# Patient Record
Sex: Male | Born: 1975 | Race: Black or African American | Hispanic: No | Marital: Single | Smoking: Current every day smoker
Health system: Southern US, Community
[De-identification: ages and names within clinical notes are randomized; demographics above are authoritative.]

## PROBLEM LIST (undated history)

## (undated) DIAGNOSIS — J45909 Unspecified asthma, uncomplicated: Secondary | ICD-10-CM

---

## 2020-11-06 ENCOUNTER — Encounter (HOSPITAL_COMMUNITY): Payer: Self-pay | Admitting: Emergency Medicine

## 2020-11-06 ENCOUNTER — Other Ambulatory Visit: Payer: Self-pay

## 2020-11-06 ENCOUNTER — Ambulatory Visit (HOSPITAL_COMMUNITY)
Admission: EM | Admit: 2020-11-06 | Discharge: 2020-11-06 | Disposition: A | Discharge status: Home / Self Care | Payer: Self-pay | Attending: Internal Medicine | Admitting: Internal Medicine

## 2020-11-06 DIAGNOSIS — J4531 Mild persistent asthma with (acute) exacerbation: Secondary | ICD-10-CM

## 2020-11-06 HISTORY — DX: Unspecified asthma, uncomplicated: J45.909

## 2020-11-06 MED ORDER — ALBUTEROL SULFATE HFA 108 (90 BASE) MCG/ACT IN AERS
2.0000 | INHALATION_SPRAY | Freq: Once | RESPIRATORY_TRACT | Status: AC
  Administered 2020-11-06: 15:00:00 2 via RESPIRATORY_TRACT

## 2020-11-06 MED ORDER — BUDESONIDE-FORMOTEROL FUMARATE 160-4.5 MCG/ACT IN AERO
2.0000 | INHALATION_SPRAY | Freq: Two times a day (BID) | RESPIRATORY_TRACT | 2 refills | Status: DC
Start: 2020-11-06 — End: 2020-11-26

## 2020-11-06 MED ORDER — PREDNISONE 10 MG PO TABS: 20.0000 mg | ORAL_TABLET | Freq: Every day | ORAL | 0 refills | Status: DC

## 2020-11-06 MED ORDER — ALBUTEROL SULFATE HFA 108 (90 BASE) MCG/ACT IN AERS
INHALATION_SPRAY | RESPIRATORY_TRACT | Status: AC
  Filled 2020-11-06: qty 6.7

## 2020-11-06 NOTE — ED Provider Notes (Signed)
[facility]    CSN: [id number] Arrival date & time: 11/06/20  1222      History   Chief Complaint Shortness of breath, cough and wheezing of 2 days duration.  HPI Jimmy Herring is a 44 y.o. male with a history of mild persistent asthma on albuterol and Symbicort comes to urgent care complaining of shortness of breath, chest tightness, wheezing and coughing of 2 days duration.  Symptoms started insidiously and has been persistent.  He does not have any inhalers at this time.  He denies any runny nose, sore throat or generalized body aches.  No fever or chills.  No sick contacts.  Patient has run out of his inhalers.  He recently moved here from [facility].   HPI  Past Medical History:  Diagnosis Date  . Asthma     There are no problems to display for this patient.   History reviewed. No pertinent surgical history.     Home Medications    Prior to Admission medications   Medication Sig Start Date End Date Taking? Authorizing Provider  budesonide-formoterol (SYMBICORT) 160-4.5 MCG/ACT inhaler Inhale 2 puffs into the lungs in the morning and at bedtime. 11/06/20  Yes Hiram Mciver, Britta Mccreedy, MD  predniSONE (DELTASONE) 10 MG tablet Take 2 tablets (20 mg total) by mouth daily. 11/06/20  Yes Alizea Pell, Britta Mccreedy, MD    Family History No family history on file.  Social History Social History   Tobacco Use  . Smoking status: Never Smoker  Substance Use Topics  . Alcohol use: Yes  . Drug use: Never     Allergies   Patient has no known allergies.   Review of Systems Review of Systems  Constitutional: Negative.   Respiratory: Negative for cough, chest tightness, shortness of breath and wheezing.   Cardiovascular: Negative for chest pain.  Gastrointestinal: Negative.      Physical Exam Triage Vital Signs ED Triage Vitals  Enc Vitals Group     BP      Pulse      Resp      Temp      Temp src      SpO2      Weight      Height      Head  Circumference      Peak Flow      Pain Score      Pain Loc      Pain Edu?      Excl. in GC?    No data found.  Updated Vital Signs BP 133/80 (BP Location: Right Arm)   Pulse 73   Temp 98 F (36.7 C) (Oral)   Resp 20   SpO2 98%   Visual Acuity Right Eye Distance:   Left Eye Distance:   Bilateral Distance:    Right Eye Near:   Left Eye Near:    Bilateral Near:     Physical Exam Vitals and nursing note reviewed.  Constitutional:      General: He is not in acute distress.    Appearance: He is not ill-appearing.  Cardiovascular:     Rate and Rhythm: Normal rate and regular rhythm.     Pulses: Normal pulses.     Heart sounds: Normal heart sounds.  Pulmonary:     Effort: Pulmonary effort is normal.     Breath sounds: No stridor. Wheezing present. No rhonchi.  Abdominal:     General: Abdomen is flat. Bowel sounds are normal.  Musculoskeletal:  General: Normal range of motion.  Skin:    Capillary Refill: Capillary refill takes less than 2 seconds.  Neurological:     Mental Status: He is alert.      UC Treatments / Results  Labs (all labs ordered are listed, but only abnormal results are displayed) Labs Reviewed - No data to display  EKG   Radiology No results found.  Procedures Procedures (including critical care time)  Medications Ordered in UC Medications  albuterol (VENTOLIN HFA) 108 (90 Base) MCG/ACT inhaler 2 puff (2 puffs Inhalation Given 11/06/20 1509)    Initial Impression / Assessment and Plan / UC Course  I have reviewed the triage vital signs and the nursing notes.  Pertinent labs & imaging results that were available during my care of the patient were reviewed by me and considered in my medical decision making (see chart for details).     1.  Mild persistent asthma with acute exacerbation: Albuterol inhaler Symbicort inhaler Prednisone 20 mg orally daily for 5 days If symptoms worsen please return to the urgent care to be  reevaluated. Final Clinical Impressions(s) / UC Diagnoses   Final diagnoses:  Mild persistent asthma with (acute) exacerbation     Discharge Instructions     Take your medications as directed If symptoms worsen please go to the emergency room for nebulization treatments.    ED Prescriptions    Medication Sig Dispense Auth. Provider   budesonide-formoterol (SYMBICORT) 160-4.5 MCG/ACT inhaler Inhale 2 puffs into the lungs in the morning and at bedtime. 1 each Kameshia Madruga, Britta Mccreedy, MD   predniSONE (DELTASONE) 10 MG tablet Take 2 tablets (20 mg total) by mouth daily. 15 tablet Blakeley Margraf, Britta Mccreedy, MD     PDMP not reviewed this encounter.   Merrilee Jansky, MD 11/06/20 [phone]

## 2020-11-06 NOTE — Discharge Instructions (Signed)
Take your medications as directed If symptoms worsen please go to the emergency room for nebulization treatments.

## 2020-11-06 NOTE — ED Triage Notes (Signed)
Patient has a history of asthma. Patient thinks his asthma is acting up.Jimmy Herring he needs albuterol, Symbicort and prednisone Reports he is not from this area, but will be staying indefinitely.  Patient has tightness, cough, congestion.  Patient does not have inhaler

## 2020-11-26 ENCOUNTER — Ambulatory Visit (HOSPITAL_COMMUNITY)
Admission: EM | Admit: 2020-11-26 | Discharge: 2020-11-26 | Disposition: A | Discharge status: Home / Self Care | Payer: Self-pay | Attending: Student | Admitting: Student

## 2020-11-26 ENCOUNTER — Encounter (HOSPITAL_COMMUNITY): Payer: Self-pay | Admitting: Emergency Medicine

## 2020-11-26 ENCOUNTER — Other Ambulatory Visit: Payer: Self-pay

## 2020-11-26 DIAGNOSIS — J4521 Mild intermittent asthma with (acute) exacerbation: Secondary | ICD-10-CM

## 2020-11-26 DIAGNOSIS — R062 Wheezing: Secondary | ICD-10-CM

## 2020-11-26 MED ORDER — ALBUTEROL SULFATE HFA 108 (90 BASE) MCG/ACT IN AERS
INHALATION_SPRAY | RESPIRATORY_TRACT | Status: AC
  Filled 2020-11-26: qty 6.7

## 2020-11-26 MED ORDER — ALBUTEROL SULFATE HFA 108 (90 BASE) MCG/ACT IN AERS
2.0000 | INHALATION_SPRAY | Freq: Once | RESPIRATORY_TRACT | Status: AC
Start: 2020-11-26 — End: 2020-11-26
  Administered 2020-11-26: 2 via RESPIRATORY_TRACT

## 2020-11-26 MED ORDER — ALBUTEROL SULFATE HFA 108 (90 BASE) MCG/ACT IN AERS: 1.0000 | INHALATION_SPRAY | Freq: Four times a day (QID) | RESPIRATORY_TRACT | 2 refills | Status: DC | PRN

## 2020-11-26 MED ORDER — METHYLPREDNISOLONE SODIUM SUCC 125 MG IJ SOLR
80.0000 mg | Freq: Once | INTRAMUSCULAR | Status: AC
  Administered 2020-11-26: 80 mg via INTRAMUSCULAR

## 2020-11-26 MED ORDER — METHYLPREDNISOLONE SODIUM SUCC 125 MG IJ SOLR
INTRAMUSCULAR | Status: AC
  Filled 2020-11-26: qty 2

## 2020-11-26 MED ORDER — BUDESONIDE-FORMOTEROL FUMARATE 160-4.5 MCG/ACT IN AERO: 2.0000 | INHALATION_SPRAY | Freq: Two times a day (BID) | RESPIRATORY_TRACT | 2 refills | Status: DC

## 2020-11-26 NOTE — ED Triage Notes (Signed)
Pt here for asthma flare up onset this am... Sx include cough and SOB  Needing refill on his rescue inhaler  A&O x4... NAD.Marland Kitchen. ambulatory

## 2020-11-26 NOTE — ED Provider Notes (Signed)
[facility]    CSN: [id number] Arrival date & time: 11/26/20  1259      History   Chief Complaint Chief Complaint  Patient presents with  . Asthma    HPI Jimmy Herring is a 45 y.o. male presenting for asthma exacerbation. He states he's out of inhalers. He was last here 11/06/2020 for asthma; he was given albuterol inhaler, which he has been using since then, though he ran out 3 days ago. Scripts for albuterol, symbicort, and prednisone were sent, but he was unable to fill these due to cost. He endorses 12 hours of shortness of breath and cough. Feeling well otherwise.  Denies fevers/chills, n/v/d,chest pain, congestion, facial pain, teeth pain, headaches, sore throat, loss of taste/smell, swollen lymph nodes, ear pain.  Denies chest pain, fevers.    HPI  Past Medical History:  Diagnosis Date  . Asthma     There are no problems to display for this patient.   History reviewed. No pertinent surgical history.     Home Medications    Prior to Admission medications   Medication Sig Start Date End Date Taking? Authorizing Provider  albuterol (VENTOLIN HFA) 108 (90 Base) MCG/ACT inhaler Inhale 1-2 puffs into the lungs every 6 (six) hours as needed for wheezing or shortness of breath. 11/26/20  Yes Rhys Martini, PA-C  budesonide-formoterol [facility]) 160-4.5 MCG/ACT inhaler Inhale 2 puffs into the lungs in the morning and at bedtime. 11/26/20  Yes Rhys Martini, PA-C    Family History History reviewed. No pertinent family history.  Social History Social History   Tobacco Use  . Smoking status: Never Smoker  . Smokeless tobacco: Never Used  Substance Use Topics  . Alcohol use: Yes  . Drug use: Never     Allergies   Patient has no known allergies.   Review of Systems Review of Systems  Constitutional: Negative for appetite change, chills and fever.  HENT: Negative for congestion, ear pain, rhinorrhea, sinus pressure, sinus pain and sore  throat.   Eyes: Negative for redness and visual disturbance.  Respiratory: Positive for cough and shortness of breath. Negative for chest tightness and wheezing.   Cardiovascular: Negative for chest pain and palpitations.  Gastrointestinal: Negative for abdominal pain, constipation, diarrhea, nausea and vomiting.  Genitourinary: Negative for dysuria, frequency and urgency.  Musculoskeletal: Negative for myalgias.  Neurological: Negative for dizziness, weakness and headaches.  Psychiatric/Behavioral: Negative for confusion.  All other systems reviewed and are negative.    Physical Exam Triage Vital Signs ED Triage Vitals [11/26/20 1346]  Enc Vitals Group     BP 123/82     Pulse Rate 72     Resp 16     Temp 98.2 F (36.8 C)     Temp Source Oral     SpO2 97 %     Weight      Height      Head Circumference      Peak Flow      Pain Score      Pain Loc      Pain Edu?      Excl. in GC?    No data found.  Updated Vital Signs BP 123/82 (BP Location: Left Arm)   Pulse 72   Temp 98.2 F (36.8 C) (Oral)   Resp 16   SpO2 97%   Visual Acuity Right Eye Distance:   Left Eye Distance:   Bilateral Distance:    Right Eye Near:   Left  Eye Near:    Bilateral Near:     Physical Exam Vitals reviewed.  Constitutional:      General: He is not in acute distress.    Appearance: Normal appearance. He is not ill-appearing.  HENT:     Head: Normocephalic and atraumatic.     Right Ear: Hearing, tympanic membrane, ear canal and external ear normal. No swelling or tenderness. There is no impacted cerumen. No mastoid tenderness. Tympanic membrane is not perforated, erythematous, retracted or bulging.     Left Ear: Hearing, tympanic membrane, ear canal and external ear normal. No swelling or tenderness. There is no impacted cerumen. No mastoid tenderness. Tympanic membrane is not perforated, erythematous, retracted or bulging.     Nose:     Right Sinus: No maxillary sinus tenderness or  frontal sinus tenderness.     Left Sinus: No maxillary sinus tenderness or frontal sinus tenderness.     Mouth/Throat:     Mouth: Mucous membranes are moist.     Pharynx: Uvula midline. No oropharyngeal exudate or posterior oropharyngeal erythema.     Tonsils: No tonsillar exudate.  Cardiovascular:     Rate and Rhythm: Normal rate and regular rhythm.     Heart sounds: Normal heart sounds.  Pulmonary:     Breath sounds: Normal air entry. Decreased breath sounds and wheezing present. No rhonchi or rales.     Comments: Scattered wheezes througout Chest:     Chest wall: No tenderness.  Abdominal:     General: Abdomen is flat. Bowel sounds are normal.     Tenderness: There is no abdominal tenderness. There is no guarding or rebound.  Lymphadenopathy:     Cervical: No cervical adenopathy.  Neurological:     General: No focal deficit present.     Mental Status: He is alert and oriented to person, place, and time.  Psychiatric:        Attention and Perception: Attention and perception normal.        Mood and Affect: Mood and affect normal.        Behavior: Behavior normal. Behavior is cooperative.        Thought Content: Thought content normal.        Judgment: Judgment normal.      UC Treatments / Results  Labs (all labs ordered are listed, but only abnormal results are displayed) Labs Reviewed - No data to display  EKG   Radiology No results found.  Procedures Procedures (including critical care time)  Medications Ordered in UC Medications  methylPREDNISolone sodium succinate (SOLU-MEDROL) 125 mg/2 mL injection 80 mg (has no administration in time range)  albuterol (VENTOLIN HFA) 108 (90 Base) MCG/ACT inhaler 2 puff (has no administration in time range)    Initial Impression / Assessment and Plan / UC Course  I have reviewed the triage vital signs and the nursing notes.  Pertinent labs & imaging results that were available during my care of the patient were reviewed  by me and considered in my medical decision making (see chart for details).     Scripts for albuterol and Symbicort sent.  Albuterol inhaler provided today. We discussed the option of prednisone taper vs prednisone shot; pt requests injection, as cost is prohibitive to him picking up his prescriptions. Provided this today.   Seek additional medical attention if chest pain, shortness of breath, persistance/worsening of symptoms despite treatment, etc.   Final Clinical Impressions(s) / UC Diagnoses   Final diagnoses:  Mild intermittent asthma with exacerbation  Wheezing     Discharge Instructions     -We gave you a shot of prednisone (steroid) today, as well as an albuterol inhaler. Use inhaler as needed.  -I also sent a prescription for Symbicort and Albuterol inhalers. I sent two refills of each. Use the albuterol inhaler as needed, and Symbicort two puffs twice daily.      ED Prescriptions    Medication Sig Dispense Auth. Provider   budesonide-formoterol (SYMBICORT) 160-4.5 MCG/ACT inhaler Inhale 2 puffs into the lungs in the morning and at bedtime. 1 each Rhys Martini, PA-C   albuterol (VENTOLIN HFA) 108 (90 Base) MCG/ACT inhaler Inhale 1-2 puffs into the lungs every 6 (six) hours as needed for wheezing or shortness of breath. 1 each Rhys Martini, PA-C     PDMP not reviewed this encounter.   Rhys Martini, PA-C 11/26/20 1449

## 2020-11-26 NOTE — Discharge Instructions (Addendum)
-  We gave you a shot of prednisone (steroid) today, as well as an albuterol inhaler. Use inhaler as needed.  -I also sent a prescription for Symbicort and Albuterol inhalers. I sent two refills of each. Use the albuterol inhaler as needed, and Symbicort two puffs twice daily.

## 2021-08-11 ENCOUNTER — Encounter (HOSPITAL_COMMUNITY): Payer: Self-pay | Admitting: Emergency Medicine

## 2021-08-11 ENCOUNTER — Emergency Department (HOSPITAL_COMMUNITY): Payer: Self-pay

## 2021-08-11 ENCOUNTER — Inpatient Hospital Stay (HOSPITAL_COMMUNITY)
Admission: EM | Admit: 2021-08-11 | Discharge: 2021-08-13 | DRG: 203 | Disposition: A | Discharge status: Home / Self Care | Payer: Self-pay | Attending: Internal Medicine | Admitting: Internal Medicine

## 2021-08-11 ENCOUNTER — Other Ambulatory Visit: Payer: Self-pay

## 2021-08-11 DIAGNOSIS — Z825 Family history of asthma and other chronic lower respiratory diseases: Secondary | ICD-10-CM

## 2021-08-11 DIAGNOSIS — J45909 Unspecified asthma, uncomplicated: Secondary | ICD-10-CM | POA: Diagnosis present

## 2021-08-11 DIAGNOSIS — Z7951 Long term (current) use of inhaled steroids: Secondary | ICD-10-CM

## 2021-08-11 DIAGNOSIS — Z789 Other specified health status: Secondary | ICD-10-CM

## 2021-08-11 DIAGNOSIS — Z20822 Contact with and (suspected) exposure to covid-19: Secondary | ICD-10-CM | POA: Diagnosis present

## 2021-08-11 DIAGNOSIS — Z87891 Personal history of nicotine dependence: Secondary | ICD-10-CM

## 2021-08-11 DIAGNOSIS — F1020 Alcohol dependence, uncomplicated: Secondary | ICD-10-CM | POA: Diagnosis present

## 2021-08-11 DIAGNOSIS — F1410 Cocaine abuse, uncomplicated: Secondary | ICD-10-CM | POA: Diagnosis present

## 2021-08-11 DIAGNOSIS — J45901 Unspecified asthma with (acute) exacerbation: Secondary | ICD-10-CM

## 2021-08-11 DIAGNOSIS — J4551 Severe persistent asthma with (acute) exacerbation: Principal | ICD-10-CM | POA: Diagnosis present

## 2021-08-11 DIAGNOSIS — F1490 Cocaine use, unspecified, uncomplicated: Secondary | ICD-10-CM

## 2021-08-11 LAB — CBC WITH DIFFERENTIAL/PLATELET
Abs Immature Granulocytes: 0.04 10*3/uL (ref 0.00–0.07)
Basophils Absolute: 0.1 10*3/uL (ref 0.0–0.1)
Basophils Relative: 1 %
Eosinophils Absolute: 0.8 10*3/uL — ABNORMAL HIGH (ref 0.0–0.5)
Eosinophils Relative: 7 %
HCT: 48.2 % (ref 39.0–52.0)
Hemoglobin: 15.8 g/dL (ref 13.0–17.0)
Immature Granulocytes: 0 %
Lymphocytes Relative: 34 %
Lymphs Abs: 4.1 10*3/uL — ABNORMAL HIGH (ref 0.7–4.0)
MCH: 33.4 pg (ref 26.0–34.0)
MCHC: 32.8 g/dL (ref 30.0–36.0)
MCV: 101.9 fL — ABNORMAL HIGH (ref 80.0–100.0)
Monocytes Absolute: 1.3 10*3/uL — ABNORMAL HIGH (ref 0.1–1.0)
Monocytes Relative: 11 %
Neutro Abs: 5.7 10*3/uL (ref 1.7–7.7)
Neutrophils Relative %: 47 %
Platelets: 285 10*3/uL (ref 150–400)
RBC: 4.73 MIL/uL (ref 4.22–5.81)
RDW: 12.3 % (ref 11.5–15.5)
WBC: 12.1 10*3/uL — ABNORMAL HIGH (ref 4.0–10.5)
nRBC: 0 % (ref 0.0–0.2)

## 2021-08-11 LAB — CBC
HCT: 44.5 % (ref 39.0–52.0)
Hemoglobin: 14.9 g/dL (ref 13.0–17.0)
MCH: 33.6 pg (ref 26.0–34.0)
MCHC: 33.5 g/dL (ref 30.0–36.0)
MCV: 100.2 fL — ABNORMAL HIGH (ref 80.0–100.0)
Platelets: 264 10*3/uL (ref 150–400)
RBC: 4.44 MIL/uL (ref 4.22–5.81)
RDW: 12.5 % (ref 11.5–15.5)
WBC: 10.6 10*3/uL — ABNORMAL HIGH (ref 4.0–10.5)
nRBC: 0 % (ref 0.0–0.2)

## 2021-08-11 LAB — BASIC METABOLIC PANEL
Anion gap: 12 (ref 5–15)
BUN: 9 mg/dL (ref 6–20)
CO2: 21 mmol/L — ABNORMAL LOW (ref 22–32)
Calcium: 8.8 mg/dL — ABNORMAL LOW (ref 8.9–10.3)
Chloride: 105 mmol/L (ref 98–111)
Creatinine, Ser: 1.21 mg/dL (ref 0.61–1.24)
GFR, Estimated: >60 mL/min (ref >60)
Glucose, Bld: 95 mg/dL (ref 70–99)
Potassium: 3.7 mmol/L (ref 3.5–5.1)
Sodium: 138 mmol/L (ref 135–145)

## 2021-08-11 LAB — RESP PANEL BY RT-PCR (FLU A&B, COVID) ARPGX2
Influenza A by PCR: NEGATIVE
Influenza B by PCR: NEGATIVE
SARS Coronavirus 2 by RT PCR: NEGATIVE

## 2021-08-11 LAB — HIV ANTIBODY (ROUTINE TESTING W REFLEX): HIV Screen 4th Generation wRfx: NONREACTIVE

## 2021-08-11 MED ORDER — IPRATROPIUM-ALBUTEROL 0.5-2.5 (3) MG/3ML IN SOLN
3.0000 mL | Freq: Four times a day (QID) | RESPIRATORY_TRACT | Status: DC
  Administered 2021-08-11 – 2021-08-13 (×10): 3 mL via RESPIRATORY_TRACT
  Filled 2021-08-11 (×10): qty 3

## 2021-08-11 MED ORDER — BUDESONIDE 0.25 MG/2ML IN SUSP
0.2500 mg | Freq: Two times a day (BID) | RESPIRATORY_TRACT | Status: DC
  Administered 2021-08-11 – 2021-08-13 (×5): 0.25 mg via RESPIRATORY_TRACT
  Filled 2021-08-11 (×5): qty 2

## 2021-08-11 MED ORDER — DEXAMETHASONE SODIUM PHOSPHATE 10 MG/ML IJ SOLN: 10.0000 mg | Freq: Once | INTRAMUSCULAR | Status: DC

## 2021-08-11 MED ORDER — IPRATROPIUM BROMIDE 0.02 % IN SOLN
0.5000 mg | Freq: Once | RESPIRATORY_TRACT | Status: AC
  Administered 2021-08-11: 0.5 mg via RESPIRATORY_TRACT
  Filled 2021-08-11: qty 2.5

## 2021-08-11 MED ORDER — ALBUTEROL SULFATE (2.5 MG/3ML) 0.083% IN NEBU
2.5000 mg | INHALATION_SOLUTION | RESPIRATORY_TRACT | Status: DC | PRN
  Administered 2021-08-11: 2.5 mg via RESPIRATORY_TRACT
  Filled 2021-08-11: qty 3

## 2021-08-11 MED ORDER — LORAZEPAM 1 MG PO TABS: 1.0000 mg | ORAL_TABLET | ORAL | Status: DC | PRN

## 2021-08-11 MED ORDER — ALBUTEROL SULFATE (2.5 MG/3ML) 0.083% IN NEBU
10.0000 mg/h | INHALATION_SOLUTION | Freq: Once | RESPIRATORY_TRACT | Status: AC
  Administered 2021-08-11: 10 mg/h via RESPIRATORY_TRACT
  Filled 2021-08-11: qty 3

## 2021-08-11 MED ORDER — MAGNESIUM SULFATE 2 GM/50ML IV SOLN: 2.0000 g | Freq: Once | INTRAVENOUS | Status: DC

## 2021-08-11 MED ORDER — METHYLPREDNISOLONE SODIUM SUCC 125 MG IJ SOLR
125.0000 mg | Freq: Once | INTRAMUSCULAR | Status: AC
  Administered 2021-08-11: 125 mg via INTRAVENOUS
  Filled 2021-08-11: qty 2

## 2021-08-11 MED ORDER — ENOXAPARIN SODIUM 40 MG/0.4ML IJ SOSY
40.0000 mg | PREFILLED_SYRINGE | INTRAMUSCULAR | Status: DC
  Administered 2021-08-11 – 2021-08-12 (×2): 40 mg via SUBCUTANEOUS
  Filled 2021-08-11 (×2): qty 0.4

## 2021-08-11 MED ORDER — ACETAMINOPHEN 500 MG PO TABS
1000.0000 mg | ORAL_TABLET | Freq: Once | ORAL | Status: AC
  Administered 2021-08-11: 1000 mg via ORAL
  Filled 2021-08-11: qty 2

## 2021-08-11 MED ORDER — IPRATROPIUM BROMIDE 0.02 % IN SOLN
0.5000 mg | Freq: Once | RESPIRATORY_TRACT | Status: AC
  Filled 2021-08-11: qty 2.5

## 2021-08-11 MED ORDER — ALBUTEROL (5 MG/ML) CONTINUOUS INHALATION SOLN: 10.0000 mg/h | INHALATION_SOLUTION | RESPIRATORY_TRACT | Status: DC

## 2021-08-11 MED ORDER — LORATADINE 10 MG PO TABS
10.0000 mg | ORAL_TABLET | Freq: Once | ORAL | Status: AC
  Administered 2021-08-11: 10 mg via ORAL
  Filled 2021-08-11: qty 1

## 2021-08-11 MED ORDER — ALBUTEROL SULFATE (2.5 MG/3ML) 0.083% IN NEBU
5.0000 mg | INHALATION_SOLUTION | Freq: Once | RESPIRATORY_TRACT | Status: AC
  Administered 2021-08-11: 5 mg via RESPIRATORY_TRACT
  Filled 2021-08-11: qty 6

## 2021-08-11 MED ORDER — ALBUTEROL SULFATE (2.5 MG/3ML) 0.083% IN NEBU
5.0000 mg | INHALATION_SOLUTION | Freq: Once | RESPIRATORY_TRACT | Status: AC
  Administered 2021-08-11: 5 mg via RESPIRATORY_TRACT

## 2021-08-11 MED ORDER — METHYLPREDNISOLONE SODIUM SUCC 125 MG IJ SOLR
120.0000 mg | Freq: Two times a day (BID) | INTRAMUSCULAR | Status: DC
  Administered 2021-08-11 – 2021-08-13 (×5): 120 mg via INTRAVENOUS
  Filled 2021-08-11 (×5): qty 2

## 2021-08-11 MED ORDER — FAMOTIDINE IN NACL 20-0.9 MG/50ML-% IV SOLN
20.0000 mg | Freq: Once | INTRAVENOUS | Status: AC
  Administered 2021-08-11: 20 mg via INTRAVENOUS
  Filled 2021-08-11: qty 50

## 2021-08-11 MED ORDER — THIAMINE HCL 100 MG PO TABS
100.0000 mg | ORAL_TABLET | Freq: Every day | ORAL | Status: DC
  Administered 2021-08-11 – 2021-08-13 (×3): 100 mg via ORAL
  Filled 2021-08-11 (×3): qty 1

## 2021-08-11 MED ORDER — IPRATROPIUM-ALBUTEROL 0.5-2.5 (3) MG/3ML IN SOLN: 3.0000 mL | Freq: Once | RESPIRATORY_TRACT | Status: DC

## 2021-08-11 MED ORDER — ADULT MULTIVITAMIN W/MINERALS CH
1.0000 | ORAL_TABLET | Freq: Every day | ORAL | Status: DC
  Administered 2021-08-11 – 2021-08-13 (×3): 1 via ORAL
  Filled 2021-08-11 (×3): qty 1

## 2021-08-11 MED ORDER — THIAMINE HCL 100 MG/ML IJ SOLN: 100.0000 mg | Freq: Every day | INTRAMUSCULAR | Status: DC

## 2021-08-11 MED ORDER — FOLIC ACID 1 MG PO TABS
1.0000 mg | ORAL_TABLET | Freq: Every day | ORAL | Status: DC
  Administered 2021-08-11 – 2021-08-13 (×3): 1 mg via ORAL
  Filled 2021-08-11 (×3): qty 1

## 2021-08-11 MED ORDER — MAGNESIUM SULFATE 2 GM/50ML IV SOLN
2.0000 g | INTRAVENOUS | Status: AC
  Administered 2021-08-11: 2 g via INTRAVENOUS
  Filled 2021-08-11: qty 50

## 2021-08-11 MED ORDER — IPRATROPIUM BROMIDE 0.02 % IN SOLN
RESPIRATORY_TRACT | Status: AC
  Administered 2021-08-11: 0.5 mg via RESPIRATORY_TRACT
  Filled 2021-08-11: qty 2.5

## 2021-08-11 MED ORDER — ALBUTEROL SULFATE (2.5 MG/3ML) 0.083% IN NEBU: 10.0000 mg/h | INHALATION_SOLUTION | Freq: Once | RESPIRATORY_TRACT | Status: DC

## 2021-08-11 MED ORDER — LORAZEPAM 2 MG/ML IJ SOLN: 1.0000 mg | INTRAMUSCULAR | Status: DC | PRN

## 2021-08-11 NOTE — ED Notes (Signed)
Pt is very unpleasant and unwilling to assist in care. Pt insists that he cannot place the mask on his face for breathing treatments. Pt continues to take off BP cuff despite being asked to leave it on. Pt also continues to complain about breakfast tray.

## 2021-08-11 NOTE — ED Provider Notes (Addendum)
Emergency Medicine Provider Triage Evaluation Note  Jimmy Herring , a 45 y.o. male  was evaluated in triage.  Pt complains of SOB. Level 5 caveat secondary to acuity of condition, moderate distress. Reports being out of his prescribed medications. Has been using albuterol from friends w/o relief. Symptoms worsened while at work tonight. C/o chest discomfort from persistent coughing.  Review of Systems  Positive: SOB, wheezing, chest pain. Negative: As above  Physical Exam  There were no vitals taken for this visit. Gen:   Awake, moderate distress Resp:  Dyspneic, tachypneic. Diffuse expiratory wheezing. Spastic, dry, nonproductive cough. MSK:   Moves extremities without difficulty Other:  Speech truncated  Medical Decision Making  Medically screening exam initiated at 1:04 AM.  Appropriate orders placed.  [name] was informed that the remainder of the evaluation will be completed by another provider, this initial triage assessment does not replace that evaluation, and the importance of remaining in the ED until their evaluation is complete.  Asthma exacerbation   Antony Madura, PA-C 08/11/21 0110    Antony Madura, PA-C 08/11/21 0119    Tilden Fossa, MD 08/11/21 0242    Antony Madura, PA-C 08/11/21 0250    Tilden Fossa, MD 08/11/21 [phone]

## 2021-08-11 NOTE — ED Notes (Signed)
Pt took off hospital gown and put on his T-shirt. Pt does not want to be in a hospital gown

## 2021-08-11 NOTE — ED Notes (Signed)
Pt requested a warm blanket and pillow. Pt provided with a warm blanket and told that if a pillow is available he will be given one, however sometimes there are no available pillows in the department. Pt states "If I call out on this call light they will find me one!!". Restated that this nurse would look for one but just wanted to let him know that there MIGHT not be one available.

## 2021-08-11 NOTE — ED Triage Notes (Signed)
Pt very SOB upon arrival to triage, wheezing and coughing causing difficulty speaking.  PA ordered breathing treatment of albuterol 5mg  and 1 atrovent.

## 2021-08-11 NOTE — ED Notes (Signed)
Small pillow found and given to pt.

## 2021-08-11 NOTE — ED Notes (Signed)
Pt ambulated to the bathroom on his own power, gait even and steady 

## 2021-08-11 NOTE — ED Provider Notes (Signed)
[facility] EMERGENCY DEPARTMENT Provider Note   CSN: [id number] Arrival date & time: 08/11/21  4098     History Chief Complaint  Patient presents with   Shortness of Breath    Jimmy Herring is a 45 y.o. male.  HPI 45 year old male with a history of asthma presents to the ER with acute asthma exacerbation.  Patient presented acutely dyspneic, with dry cough, clearly in respiratory distress.  Patient states that he has been out of his albuterol and Symbicort, moved to the area from [facility] recently) per chart review, this appears to be in late 2021).  He states he started wheezing several days ago but has been "fighting it".  He has not been taking any of his medicines.  He reports prior hospitalization in [facility] and required intubation for his asthma exacerbation in the past.  Denies any fevers or chills    Past Medical History:  Diagnosis Date   Asthma     Patient Active Problem List   Diagnosis Date Noted   Asthma exacerbation 08/11/2021    History reviewed. No pertinent surgical history.     No family history on file.  Social History   Tobacco Use   Smoking status: Never   Smokeless tobacco: Never  Substance Use Topics   Alcohol use: Yes   Drug use: Never    Home Medications Prior to Admission medications   Medication Sig Start Date End Date Taking? Authorizing Provider  albuterol (VENTOLIN HFA) 108 (90 Base) MCG/ACT inhaler Inhale 1-2 puffs into the lungs every 6 (six) hours as needed for wheezing or shortness of breath. 11/26/20  Yes Rhys Martini, PA-C  budesonide-formoterol [facility]) 160-4.5 MCG/ACT inhaler Inhale 2 puffs into the lungs in the morning and at bedtime. Patient not taking: No sig reported 11/26/20   Rhys Martini, PA-C    Allergies    Patient has no known allergies.  Review of Systems   Review of Systems Ten systems reviewed and are negative for acute change, except as noted in the HPI.    Physical Exam Updated Vital Signs BP 130/74   Pulse (!) 104   Temp 97.6 F (36.4 C) (Oral)   Resp 19   SpO2 99%   Physical Exam Vitals and nursing note reviewed.  Constitutional:      General: He is in acute distress.     Appearance: He is well-developed. He is ill-appearing.  HENT:     Head: Normocephalic and atraumatic.  Eyes:     Conjunctiva/sclera: Conjunctivae normal.  Cardiovascular:     Rate and Rhythm: Normal rate and regular rhythm.     Heart sounds: No murmur heard. Pulmonary:     Effort: Tachypnea and accessory muscle usage present.     Breath sounds: Examination of the right-upper field reveals wheezing. Examination of the left-upper field reveals wheezing. Examination of the right-lower field reveals wheezing. Examination of the left-lower field reveals wheezing. Wheezing present.  Chest:     Chest wall: No tenderness.  Abdominal:     Palpations: Abdomen is soft.     Tenderness: There is no abdominal tenderness.  Musculoskeletal:     Cervical back: Neck supple.  Skin:    General: Skin is warm and dry.  Neurological:     Mental Status: He is alert.    ED Results / Procedures / Treatments   Labs (all labs ordered are listed, but only abnormal results are displayed) Labs Reviewed  BASIC METABOLIC PANEL -  Abnormal; Notable for the following components:      Result Value   CO2 21 (*)    Calcium 8.8 (*)    All other components within normal limits  CBC WITH DIFFERENTIAL/PLATELET - Abnormal; Notable for the following components:   WBC 12.1 (*)    MCV 101.9 (*)    Lymphs Abs 4.1 (*)    Monocytes Absolute 1.3 (*)    Eosinophils Absolute 0.8 (*)    All other components within normal limits  RESP PANEL BY RT-PCR (FLU A&B, COVID) [id number]    EKG None  Radiology DG Chest Portable 1 View  Result Date: 08/11/2021 CLINICAL DATA:  Shortness of breath. EXAM: PORTABLE CHEST 1 VIEW COMPARISON:  None. FINDINGS: The heart size and mediastinal contours are within  normal limits. Both lungs are clear. The visualized skeletal structures are unremarkable. IMPRESSION: No active disease. Electronically Signed   By: Aram Candela M.D.   On: 08/11/2021 01:34    Procedures .Critical Care Performed by: Mare Ferrari, PA-C Authorized by: Mare Ferrari, PA-C   Critical care provider statement:    Critical care time (minutes):  60   Critical care was necessary to treat or prevent imminent or life-threatening deterioration of the following conditions:  Respiratory failure   Critical care was time spent personally by me on the following activities:  Discussions with consultants, evaluation of patient's response to treatment, examination of patient, ordering and performing treatments and interventions, ordering and review of laboratory studies, ordering and review of radiographic studies, pulse oximetry, re-evaluation of patient's condition, obtaining history from patient or surrogate and review of old charts   Medications Ordered in ED Medications  albuterol (PROVENTIL) (2.5 MG/3ML) 0.083% nebulizer solution (10 mg/hr Nebulization Not Given 08/11/21 0215)  albuterol (PROVENTIL) (2.5 MG/3ML) 0.083% nebulizer solution 5 mg (5 mg Nebulization Given 08/11/21 0107)  ipratropium (ATROVENT) nebulizer solution 0.5 mg (0.5 mg Nebulization Given 08/11/21 0108)  magnesium sulfate IVPB 2 g 50 mL (0 g Intravenous Stopped 08/11/21 0224)  acetaminophen (TYLENOL) tablet 1,000 mg (1,000 mg Oral Given 08/11/21 0112)  albuterol (PROVENTIL) (2.5 MG/3ML) 0.083% nebulizer solution 5 mg (5 mg Nebulization Given 08/11/21 0119)  ipratropium (ATROVENT) nebulizer solution 0.5 mg (0.5 mg Nebulization Given 08/11/21 0119)  methylPREDNISolone sodium succinate (SOLU-MEDROL) 125 mg/2 mL injection 125 mg (125 mg Intravenous Given 08/11/21 0138)  albuterol (PROVENTIL) (2.5 MG/3ML) 0.083% nebulizer solution (10 mg/hr Nebulization Given 08/11/21 0200)  loratadine (CLARITIN) tablet 10 mg (10 mg Oral  Given 08/11/21 0233)  famotidine (PEPCID) IVPB 20 mg premix (20 mg Intravenous New Bag/Given 08/11/21 0234)    ED Course  I have reviewed the triage vital signs and the nursing notes.  Pertinent labs & imaging results that were available during my care of the patient were reviewed by me and considered in my medical decision making (see chart for details).    MDM Rules/Calculators/A&P                           45 year old male who presents to the ER with acute asthma exacerbation.  Patient arrived very tachypneic, [place] of breath, with audible wheezing.  He received multiple butyryl treatments, DuoNeb, continuous albuterol, 2 g of magnesium, Claritin, Pepcid, on evaluation respiratory effort has improved though he still feels [place] of breath.  BMP without any significant abnormalities noted, CBC with mild leukocytosis of 12.1.  COVID and flu are negative.  Chest x-ray without evidence of pneumonia.  EKG  with sinus tach.  Plan for admission for acute asthma exacerbation for continuous DuoNeb treatment and close monitoring.  Patient currently receiving continuous albuterol treatment.  He is agreeable to admission.  Consulted hospitalist team who will admit for further evaluation and treatment  Discussed the case with Dr. Nicanor Alcon who is agreeable to the above plan and disposition   Final Clinical Impression(s) / ED Diagnoses Final diagnoses:  Severe persistent asthma with exacerbation    Rx / DC Orders ED Discharge Orders     None        Leone Brand 08/11/21 3790    Palumbo, April, MD 08/11/21 [phone]

## 2021-08-11 NOTE — ED Notes (Signed)
Pt requested and provided ice and 2 apple juices. Pt is unhappy that there is not meat on his breakfast tray.

## 2021-08-11 NOTE — ED Notes (Signed)
Pt requested and given another cup of ice water

## 2021-08-11 NOTE — ED Notes (Signed)
Pt not yet transferred to rm 47. Pt resting in rm 30, belongings in belongings bag and pt ready for transfer to 47 as soon as room is available

## 2021-08-11 NOTE — H&P (Signed)
History and Physical    Reyaansh Merlo Dahmen WIO:[id number] DOB: 05-Oct-1976 DOA: 08/11/2021  PCP: Patient, No Pcp Per (Inactive) Patient coming from: Home  Chief Complaint: Shortness of breath  HPI: Kaseem Vastine is a 45 y.o. male with medical history significant of asthma presented to the ED for evaluation of shortness of breath, cough, and wheezing.  He was in respiratory distress on arrival to the ED.  Very tachypneic, dyspneic, with audible wheezing.  Not hypoxic.  He was given multiple albuterol and Atrovent treatments, continuous albuterol neb, IV magnesium 2 g, IV Solu-Medrol 125 mg, Claritin, and Pepcid after which is respiratory effort improved significantly.  CBC with mild leukocytosis.  BMP without significant abnormalities.  COVID and flu negative.  Chest x-ray without evidence of pneumonia.  Patient reports having shortness of breath, cough, and wheezing for the past few days.  He is from [facility] and currently in [place] for work and using his friend's albuterol inhaler but it is not helping.  States he was previously on Symbicort and albuterol but ran out of his inhalers about 8 months ago.  He does not have a primary care physician and his inhalers were previously prescribed to him by urgent care.  Reports being intubated for his asthma about 2 years ago at a hospital in [facility].  Denies fevers or chest pain.  Denies history of blood clots.  He drinks a sixpack of beer daily and snorts cocaine.  Last used cocaine 3 days ago.    Review of Systems:  All systems reviewed and apart from history of presenting illness, are negative.  Past Medical History:  Diagnosis Date   Asthma     History reviewed. No pertinent surgical history.   reports that he has quit smoking. His smoking use included cigarettes. He has never used smokeless tobacco. He reports current alcohol use. He reports current drug use. Drug: Cocaine.  No Known Allergies  Family History  Problem  Relation Age of Onset   COPD Sister    Asthma Sister     Prior to Admission medications   Medication Sig Start Date End Date Taking? Authorizing Provider  albuterol (VENTOLIN HFA) 108 (90 Base) MCG/ACT inhaler Inhale 1-2 puffs into the lungs every 6 (six) hours as needed for wheezing or shortness of breath. 11/26/20  Yes Rhys Martini, PA-C  budesonide-formoterol [facility]) 160-4.5 MCG/ACT inhaler Inhale 2 puffs into the lungs in the morning and at bedtime. Patient not taking: No sig reported 11/26/20   Rhys Martini, PA-C    Physical Exam: Vitals:   08/11/21 0201 08/11/21 0215 08/11/21 0300 08/11/21 0315  BP:  130/74 125/75 120/80  Pulse:  (!) 104 97 (!) 109  Resp:  19 16 13   Temp:      TempSrc:      SpO2: 97% 99% 100% 98%    Physical Exam Constitutional:      General: He is not in acute distress. HENT:     Head: Normocephalic and atraumatic.  Eyes:     Extraocular Movements: Extraocular movements intact.     Conjunctiva/sclera: Conjunctivae normal.  Cardiovascular:     Rate and Rhythm: Regular rhythm.     Pulses: Normal pulses.     Comments: Borderline tachycardic Pulmonary:     Effort: Pulmonary effort is normal. No respiratory distress.     Breath sounds: Wheezing present. No rales.     Comments: Diffuse end expiratory wheezing No tachypnea or respiratory distress, satting in the mid 90s  on room air Abdominal:     General: Bowel sounds are normal. There is no distension.     Palpations: Abdomen is soft.     Tenderness: There is no abdominal tenderness.  Musculoskeletal:        General: No swelling or tenderness.     Cervical back: Normal range of motion and neck supple.  Skin:    General: Skin is warm and dry.  Neurological:     General: No focal deficit present.     Mental Status: He is alert and oriented to person, place, and time.     Labs on Admission: I have personally reviewed following labs and imaging studies  CBC: Recent Labs  Lab  08/11/21 0140  WBC 12.1*  NEUTROABS 5.7  HGB 15.8  HCT 48.2  MCV 101.9*  PLT 285   Basic Metabolic Panel: Recent Labs  Lab 08/11/21 0140  NA 138  K 3.7  CL 105  CO2 21*  GLUCOSE 95  BUN 9  CREATININE 1.21  CALCIUM 8.8*   GFR: CrCl cannot be calculated (Unknown ideal weight.). Liver Function Tests: No results for input(s): AST, ALT, ALKPHOS, BILITOT, PROT, ALBUMIN in the last 168 hours. No results for input(s): LIPASE, AMYLASE in the last 168 hours. No results for input(s): AMMONIA in the last 168 hours. Coagulation Profile: No results for input(s): INR, PROTIME in the last 168 hours. Cardiac Enzymes: No results for input(s): CKTOTAL, CKMB, CKMBINDEX, TROPONINI in the last 168 hours. BNP (last 3 results) No results for input(s): PROBNP in the last 8760 hours. HbA1C: No results for input(s): HGBA1C in the last 72 hours. CBG: No results for input(s): GLUCAP in the last 168 hours. Lipid Profile: No results for input(s): CHOL, HDL, LDLCALC, TRIG, CHOLHDL, LDLDIRECT in the last 72 hours. Thyroid Function Tests: No results for input(s): TSH, T4TOTAL, FREET4, T3FREE, THYROIDAB in the last 72 hours. Anemia Panel: No results for input(s): VITAMINB12, FOLATE, FERRITIN, TIBC, IRON, RETICCTPCT in the last 72 hours. Urine analysis: No results found for: COLORURINE, APPEARANCEUR, LABSPEC, PHURINE, GLUCOSEU, HGBUR, BILIRUBINUR, KETONESUR, PROTEINUR, UROBILINOGEN, NITRITE, LEUKOCYTESUR  Radiological Exams on Admission: DG Chest Portable 1 View  Result Date: 08/11/2021 CLINICAL DATA:  Shortness of breath. EXAM: PORTABLE CHEST 1 VIEW COMPARISON:  None. FINDINGS: The heart size and mediastinal contours are within normal limits. Both lungs are clear. The visualized skeletal structures are unremarkable. IMPRESSION: No active disease. Electronically Signed   By: Aram Candela M.D.   On: 08/11/2021 01:34    EKG: Independently reviewed.  Sinus tachycardia, no prior tracing for  comparison.  Assessment/Plan Principal Problem:   Asthma exacerbation Active Problems:   Alcohol use   Cocaine use   Acute asthma exacerbation He was in respiratory distress on arrival to the ED.  Very tachypneic, dyspneic, with audible wheezing.  Not hypoxic.  He was given multiple albuterol and Atrovent treatments, continuous albuterol neb, IV magnesium 2 g, IV Solu-Medrol 125 mg, Claritin, and Pepcid after which his respiratory effort improved significantly.  Still wheezing but not tachypneic and satting well on room air.  Patient ran out of his home inhalers 8 months ago, does not have a PCP.  Also uses cocaine which can induce bronchospasm. -Solu-Medrol 60 mg every 6 hours -DuoNeb every 6 hours -Pulmicort neb twice daily -Albuterol nebulizer as needed -Continuous pulse ox, supplemental oxygen if needed  Alcohol use disorder Drinks a sixpack of beer daily.  No signs of withdrawal at this time. -CIWA protocol; Ativan as needed -Thiamine,  folate, multivitamin  Cocaine use Last snorted cocaine 3 days ago. -Continue to counsel to quit.  DVT prophylaxis: Lovenox Code Status: Full code Family Communication: No family available at this time. Disposition Plan: Status is: Observation  The patient remains OBS appropriate and will d/c before 2 midnights.  Dispo: The patient is from: Home              Anticipated d/c is to: Home              Patient currently is not medically stable to d/c.   Difficult to place patient No  Level of care:  Level of care: Telemetry Medical  The medical decision making on this patient was of high complexity and the patient is at high risk for clinical deterioration, therefore this is a level 3 visit.  John Giovanni MD [facility]  If 7PM-7AM, please contact night-coverage [email or web address]  08/11/2021, 4:19 AM

## 2021-08-12 DIAGNOSIS — Z789 Other specified health status: Secondary | ICD-10-CM

## 2021-08-12 DIAGNOSIS — J45909 Unspecified asthma, uncomplicated: Secondary | ICD-10-CM | POA: Diagnosis present

## 2021-08-12 DIAGNOSIS — J4551 Severe persistent asthma with (acute) exacerbation: Principal | ICD-10-CM

## 2021-08-12 MED ORDER — ALBUTEROL SULFATE (2.5 MG/3ML) 0.083% IN NEBU: 3.0000 mL | INHALATION_SOLUTION | RESPIRATORY_TRACT | Status: DC | PRN

## 2021-08-12 NOTE — Progress Notes (Signed)
Pt refusing cardiac monitoring. Will make attending MD aware.

## 2021-08-12 NOTE — Progress Notes (Signed)
PROGRESS NOTE  Jimmy Herring    DOB: 1976-07-30, 45 y.o.  YYQ:[id number]  PCP: Patient, No Pcp Per (Inactive)   Code Status: Full Code   DOA: 08/11/2021   LOS: 0  Brief Narrative of Current Hospitalization  Jimmy Herring is a 45 y.o. male with a PMH significant for moderate intermittent asthma. They presented from home to the ED on 08/11/2021 with shortness of breath cough and wheezing x a few days. In the ED, it was found that they had asthma exacerbation. They were treated with steroids and intermittent breathing treatments.  Patient was admitted to medicine service for further workup and management of asthma exacerbation as outlined in detail below.  08/12/21 -patient is much improved  Assessment & Plan  Principal Problem:   Asthma exacerbation Active Problems:   Alcohol use   Cocaine use  Acute asthma exacerbation-likely stimulated by viral upper respiratory infection and he did not have home inhalers for treatment.  Patient has remained stable on room air since presentation.  He has no signs of respiratory distress or increased work of breathing today but continues to have moderate diffuse inspiratory and expiratory wheezing. -Continue scheduled DuoNebs -Methylprednisolone 120 mg twice daily (10/3 -10/8) -Consult TOC pharmacy to help refill patient's Pulmicort and rescue inhalers prior to discharge  Alcohol dependence-stable since admission -Continue CIWA protocols -Thiamine, folate, multivitamin  DVT prophylaxis: enoxaparin (LOVENOX) injection 40 mg Start: 08/11/21 1600   Diet:  Diet Orders (From admission, onward)     Start     Ordered   08/11/21 0424  Diet Heart Room service appropriate? Yes; Fluid consistency: Thin  Diet effective now       Question Answer Comment  Room service appropriate? Yes   Fluid consistency: Thin      08/11/21 0426            Subjective 08/12/21    Pt reports does not feel safe going home although he endorses that his  respiratory status has much improved since his presentation.  Disposition Plan & Communication  Status is: Observation  The patient remains OBS appropriate and will d/c before 2 midnights.  Dispo: The patient is from: Home              Anticipated d/c is to: Home              Patient currently is not medically stable to d/c.  Will likely discharge home tomorrow.   Difficult to place patient No  Family Communication: None  Consults, Procedures, Significant Events  Consultants:  None  Procedures/significant events:  None  Antimicrobials:  Anti-infectives (From admission, onward)    None        Objective   Vitals:   08/12/21 0326 08/12/21 0400 08/12/21 0500 08/12/21 0700  BP: 115/70 (!) 131/94 106/64 109/82  Pulse: 81 94 88 75  Resp:  17 15 14   Temp:      TempSrc:      SpO2:  98% 96% 98%    Intake/Output Summary (Last 24 hours) at 08/12/2021 1101 Last data filed at 08/12/2021 0325 Gross per 24 hour  Intake 240 ml  Output 900 ml  Net -660 ml   There were no vitals filed for this visit.  Patient BMI: There is no height or weight on file to calculate BMI.   Physical Exam: General: awake, alert, NAD HEENT: atraumatic, clear conjunctiva, anicteric sclera, moist mucus membranes, hearing grossly normal Respiratory: Diffuse scattered inspiratory and expiratory wheezes but also does  sound more like stridor then coming from lungs themselves. no rales or rhonchi, normal respiratory effort. Cardiovascular: normal S1/S2,  RRR, no JVD, murmurs, rubs, gallops, quick capillary refill  Nervous: A&O x4. no gross focal neurologic deficits, normal speech Extremities: moves all equally, no edema, normal tone Skin: dry, intact, normal temperature, normal color, No rashes, lesions or ulcers Psychiatry: normal mood, congruent affect  Labs   I have personally reviewed following labs and imaging studies Admission on 08/11/2021  Component Date Value Ref Range Status   SARS Coronavirus  2 by RT PCR 08/11/2021 NEGATIVE  NEGATIVE Final   Influenza A by PCR 08/11/2021 NEGATIVE  NEGATIVE Final   Influenza B by PCR 08/11/2021 NEGATIVE  NEGATIVE Final   Sodium 08/11/2021 138  135 - 145 mmol/L Final   Potassium 08/11/2021 3.7  3.5 - 5.1 mmol/L Final   Chloride 08/11/2021 105  98 - 111 mmol/L Final   CO2 08/11/2021 21 (A) 22 - 32 mmol/L Final   Glucose, Bld 08/11/2021 95  70 - 99 mg/dL Final   BUN 96/78/9381 9  6 - 20 mg/dL Final   Creatinine, Ser 08/11/2021 1.21  0.61 - 1.24 mg/dL Final   Calcium 01/75/1025 8.8 (A) 8.9 - 10.3 mg/dL Final   GFR, Estimated 08/11/2021 >60  >60 mL/min Final   Anion gap 08/11/2021 12  5 - 15 Final   WBC 08/11/2021 12.1 (A) 4.0 - 10.5 K/uL Final   RBC 08/11/2021 4.73  4.22 - 5.81 MIL/uL Final   Hemoglobin 08/11/2021 15.8  13.0 - 17.0 g/dL Final   HCT 85/27/7824 48.2  39.0 - 52.0 % Final   MCV 08/11/2021 101.9 (A) 80.0 - 100.0 fL Final   MCH 08/11/2021 33.4  26.0 - 34.0 pg Final   MCHC 08/11/2021 32.8  30.0 - 36.0 g/dL Final   RDW 23/53/6144 12.3  11.5 - 15.5 % Final   Platelets 08/11/2021 285  150 - 400 K/uL Final   nRBC 08/11/2021 0.0  0.0 - 0.2 % Final   Neutrophils Relative % 08/11/2021 47  % Final   Neutro Abs 08/11/2021 5.7  1.7 - 7.7 K/uL Final   Lymphocytes Relative 08/11/2021 34  % Final   Lymphs Abs 08/11/2021 4.1 (A) 0.7 - 4.0 K/uL Final   Monocytes Relative 08/11/2021 11  % Final   Monocytes Absolute 08/11/2021 1.3 (A) 0.1 - 1.0 K/uL Final   Eosinophils Relative 08/11/2021 7  % Final   Eosinophils Absolute 08/11/2021 0.8 (A) 0.0 - 0.5 K/uL Final   Basophils Relative 08/11/2021 1  % Final   Basophils Absolute 08/11/2021 0.1  0.0 - 0.1 K/uL Final   Immature Granulocytes 08/11/2021 0  % Final   Abs Immature Granulocytes 08/11/2021 0.04  0.00 - 0.07 K/uL Final   HIV Screen 4th Generation wRfx 08/11/2021 Non Reactive  Non Reactive Final   WBC 08/11/2021 10.6 (A) 4.0 - 10.5 K/uL Final   RBC 08/11/2021 4.44  4.22 - 5.81 MIL/uL Final    Hemoglobin 08/11/2021 14.9  13.0 - 17.0 g/dL Final   HCT 31/54/0086 44.5  39.0 - 52.0 % Final   MCV 08/11/2021 100.2 (A) 80.0 - 100.0 fL Final   MCH 08/11/2021 33.6  26.0 - 34.0 pg Final   MCHC 08/11/2021 33.5  30.0 - 36.0 g/dL Final   RDW 76/19/5093 12.5  11.5 - 15.5 % Final   Platelets 08/11/2021 264  150 - 400 K/uL Final   nRBC 08/11/2021 0.0  0.0 - 0.2 % Final  Imaging Studies  No results found. Medications   Scheduled Meds:  budesonide (PULMICORT) nebulizer solution  0.25 mg Nebulization BID   enoxaparin (LOVENOX) injection  40 mg Subcutaneous Q24H   folic acid  1 mg Oral Daily   ipratropium-albuterol  3 mL Nebulization Q6H   methylPREDNISolone (SOLU-MEDROL) injection  120 mg Intravenous Q12H   multivitamin with minerals  1 tablet Oral Daily   thiamine  100 mg Oral Daily   Or   thiamine  100 mg Intravenous Daily     LOS: 0 days   Time spent: >33min  Leeroy Bock, DO [facility] 08/12/2021, 11:01 AM   To contact the [facility] Attending or Consulting provider for this patient: Check the care team in [facility] for a) attending/consulting [facility] provider listed and b) the [facility] team listed Log into [email or web address] and use [place]'s universal password to access. If you do not have the password, please contact the hospital operator. Locate the [facility] provider you are looking for under [facility] and page to a number that you can be directly reached. If you still have difficulty reaching the provider, please page the [facility] (Director on Call) for the Hospitalists listed on amion for assistance.

## 2021-08-12 NOTE — Progress Notes (Signed)
Pt arrived to floor around 1625 via wheelchair by ED NT. Pt ambulatory from wheelchair to hospital bed. Gait steady. Pt alert and oriented x4 in no acute distress. VSS. Respirations even and nonlabored on room air. Assessments completed. Pt oriented to room. Encouraged pt to use call bell for assistance and any needs. Call bell within reach. Bed in low position.

## 2021-08-13 ENCOUNTER — Other Ambulatory Visit (HOSPITAL_COMMUNITY): Payer: Self-pay

## 2021-08-13 DIAGNOSIS — F1490 Cocaine use, unspecified, uncomplicated: Secondary | ICD-10-CM

## 2021-08-13 MED ORDER — ALBUTEROL SULFATE HFA 108 (90 BASE) MCG/ACT IN AERS
1.0000 | INHALATION_SPRAY | Freq: Four times a day (QID) | RESPIRATORY_TRACT | 2 refills | Status: DC | PRN
  Filled 2021-08-13: qty 18, 25d supply, fill #0

## 2021-08-13 MED ORDER — PREDNISONE 5 MG PO TABS: 50.0000 mg | ORAL_TABLET | Freq: Every day | ORAL | Status: DC

## 2021-08-13 MED ORDER — IBUPROFEN 600 MG PO TABS
600.0000 mg | ORAL_TABLET | Freq: Three times a day (TID) | ORAL | 0 refills | Status: AC | PRN
  Filled 2021-08-13: qty 8, 3d supply, fill #0

## 2021-08-13 MED ORDER — PANTOPRAZOLE SODIUM 40 MG PO TBEC
40.0000 mg | DELAYED_RELEASE_TABLET | Freq: Every day | ORAL | 0 refills | Status: DC
  Filled 2021-08-13: qty 7, 7d supply, fill #0

## 2021-08-13 MED ORDER — IPRATROPIUM-ALBUTEROL 0.5-2.5 (3) MG/3ML IN SOLN: 3.0000 mL | Freq: Two times a day (BID) | RESPIRATORY_TRACT | Status: DC

## 2021-08-13 MED ORDER — BUDESONIDE-FORMOTEROL FUMARATE 160-4.5 MCG/ACT IN AERO
2.0000 | INHALATION_SPRAY | Freq: Two times a day (BID) | RESPIRATORY_TRACT | 2 refills | Status: DC
  Filled 2021-08-13 (×2): qty 10.2, 30d supply, fill #0

## 2021-08-13 MED ORDER — ACETAMINOPHEN 325 MG PO TABS
650.0000 mg | ORAL_TABLET | Freq: Once | ORAL | Status: AC
  Administered 2021-08-13: 650 mg via ORAL
  Filled 2021-08-13: qty 2

## 2021-08-13 MED ORDER — IBUPROFEN 600 MG PO TABS
600.0000 mg | ORAL_TABLET | Freq: Once | ORAL | Status: AC
  Administered 2021-08-13: 600 mg via ORAL
  Filled 2021-08-13: qty 1

## 2021-08-13 MED ORDER — PREDNISONE 50 MG PO TABS
50.0000 mg | ORAL_TABLET | Freq: Every day | ORAL | 0 refills | Status: DC
  Filled 2021-08-13: qty 4, 4d supply, fill #0

## 2021-08-13 NOTE — TOC Transition Note (Addendum)
Transition of Care [facility]) - CM/SW Discharge Note   Patient Details  Name: Jimmy Herring MRN: [id number] Date of Birth: 1976/02/28  Transition of Care [facility] CM/SW Contact:  Lawerance Sabal, RN Phone Number: 08/13/2021, 11:02 AM   Clinical Narrative:    PCP appointment made for 10/17 at [facility]. MATCH entered into system for DC meds. Requested MD to send scripts through [facility] pharmacy, so they can be sent home w patient. CM will review plan w patient.  Taxi voucher provided.     Final next level of care: Home/Self Care Barriers to Discharge: No Barriers Identified   Patient Goals and CMS Choice        Discharge Placement                       Discharge Plan and Services                                     Social Determinants of Health (SDOH) Interventions     Readmission Risk Interventions No flowsheet data found.

## 2021-08-13 NOTE — Discharge Summary (Signed)
Physician Discharge Summary  Nicholson Starace Utecht DZH:[id number] DOB: September 07, 1976 DOA: 08/11/2021  [facility]  Admit date: 08/11/2021 Discharge date: 08/13/2021  Admitted From: home Discharge disposition: home   Recommendations for Outpatient Follow-Up:   Cocaine/alcohol cessation   Discharge Diagnosis:   Principal Problem:   Asthma exacerbation Active Problems:   Alcohol use   Cocaine use   Asthma    Discharge Condition: Improved.  Diet recommendation: Low sodium, heart healthy  Wound care: None.  Code status: Full.   History of Present Illness:   Jimmy Herring is a 45 y.o. male with medical history significant of asthma presented to the ED for evaluation of shortness of breath, cough, and wheezing.  He was in respiratory distress on arrival to the ED.  Very tachypneic, dyspneic, with audible wheezing.  Not hypoxic.  He was given multiple albuterol and Atrovent treatments, continuous albuterol neb, IV magnesium 2 g, IV Solu-Medrol 125 mg, Claritin, and Pepcid after which is respiratory effort improved significantly.  CBC with mild leukocytosis.  BMP without significant abnormalities.  COVID and flu negative.  Chest x-ray without evidence of pneumonia.   Patient reports having shortness of breath, cough, and wheezing for the past few days.  He is from [facility] and currently in [place] for work and using his friend's albuterol inhaler but it is not helping.  States he was previously on Symbicort and albuterol but ran out of his inhalers about 8 months ago.  He does not have a primary care physician and his inhalers were previously prescribed to him by urgent care.  Reports being intubated for his asthma about 2 years ago at a hospital in [facility].  Denies fevers or chest pain.  Denies history of blood clots.  He drinks a sixpack of beer daily and snorts cocaine.  Last used cocaine 3 days ago.     Hospital Course by Problem:    Acute asthma exacerbation-likely stimulated by viral upper respiratory infection and he did not have home inhalers for treatment.  Patient has remained stable on room air since presentation. No wheezing on day of discharge -Continue scheduled DuoNebs -steroid burst -Consult TOC pharmacy to help refill patient's Pulmicort and rescue inhalers prior to discharge   Alcohol dependence-stable since admission -no sign of withdrawal  Cocaine abuse -encourage cessation   Medical Consultants:      Discharge Exam:   Vitals:   08/13/21 0716 08/13/21 0818  BP:  131/82  Pulse: 68 71  Resp: 16 18  Temp:  97.6 F (36.4 C)  SpO2:  97%   Vitals:   08/13/21 0144 08/13/21 0343 08/13/21 0716 08/13/21 0818  BP:  117/84  131/82  Pulse:  69 68 71  Resp:  20 16 18   Temp:  97.9 F (36.6 C)  97.6 F (36.4 C)  TempSrc:  Oral  Axillary  SpO2: 95% 94%  97%  Weight:      Height:        General exam: Appears calm and comfortable.  The results of significant diagnostics from this hospitalization (including imaging, microbiology, ancillary and laboratory) are listed below for reference.     Procedures and Diagnostic Studies:   DG Chest Portable 1 View  Result Date: 08/11/2021 CLINICAL DATA:  Shortness of breath. EXAM: PORTABLE CHEST 1 VIEW COMPARISON:  None. FINDINGS: The heart size and mediastinal contours are within normal limits. Both lungs are clear. The visualized skeletal structures are unremarkable. IMPRESSION: No  active disease. Electronically Signed   By: Aram Candela M.D.   On: 08/11/2021 01:34     Labs:   Basic Metabolic Panel: Recent Labs  Lab 08/11/21 0140  NA 138  K 3.7  CL 105  CO2 21*  GLUCOSE 95  BUN 9  CREATININE 1.21  CALCIUM 8.8*   GFR Estimated Creatinine Clearance: 75 mL/min (by C-G formula based on SCr of 1.21 mg/dL). Liver Function Tests: No results for input(s): AST, ALT, ALKPHOS, BILITOT, PROT, ALBUMIN in the last 168 hours. No results for  input(s): LIPASE, AMYLASE in the last 168 hours. No results for input(s): AMMONIA in the last 168 hours. Coagulation profile No results for input(s): INR, PROTIME in the last 168 hours.  CBC: Recent Labs  Lab 08/11/21 0140 08/11/21 0616  WBC 12.1* 10.6*  NEUTROABS 5.7  --   HGB 15.8 14.9  HCT 48.2 44.5  MCV 101.9* 100.2*  PLT 285 264   Cardiac Enzymes: No results for input(s): CKTOTAL, CKMB, CKMBINDEX, TROPONINI in the last 168 hours. BNP: Invalid input(s): POCBNP CBG: No results for input(s): GLUCAP in the last 168 hours. D-Dimer No results for input(s): DDIMER in the last 72 hours. Hgb A1c No results for input(s): HGBA1C in the last 72 hours. Lipid Profile No results for input(s): CHOL, HDL, LDLCALC, TRIG, CHOLHDL, LDLDIRECT in the last 72 hours. Thyroid function studies No results for input(s): TSH, T4TOTAL, T3FREE, THYROIDAB in the last 72 hours.  Invalid input(s): FREET3 Anemia work up No results for input(s): VITAMINB12, FOLATE, FERRITIN, TIBC, IRON, RETICCTPCT in the last 72 hours. Microbiology Recent Results (from the past 240 hour(s))  Resp Panel by RT-PCR (Flu A&B, Covid) Nasopharyngeal Swab     Status: None   Collection Time: 08/11/21  1:29 AM   Specimen: Nasopharyngeal Swab; Nasopharyngeal(NP) swabs in vial transport medium  Result Value Ref Range Status   SARS Coronavirus 2 by RT PCR NEGATIVE NEGATIVE Final    Comment: (NOTE) SARS-CoV-2 target nucleic acids are NOT DETECTED.  The SARS-CoV-2 RNA is generally detectable in upper respiratory specimens during the acute phase of infection. The lowest concentration of SARS-CoV-2 viral copies this assay can detect is 138 copies/mL. A negative result does not preclude SARS-Cov-2 infection and should not be used as the sole basis for treatment or other patient management decisions. A negative result may occur with  improper specimen collection/handling, submission of specimen other than nasopharyngeal swab,  presence of viral mutation(s) within the areas targeted by this assay, and inadequate number of viral copies(<138 copies/mL). A negative result must be combined with clinical observations, patient history, and epidemiological information. The expected result is Negative.  Fact Sheet for Patients:  BloggerCourse.com  Fact Sheet for Healthcare Providers:  SeriousBroker.it  This test is no t yet approved or cleared by the [place] FDA and  has been authorized for detection and/or diagnosis of SARS-CoV-2 by FDA under an Emergency Use Authorization (EUA). This EUA will remain  in effect (meaning this test can be used) for the duration of the COVID-19 declaration under Section 564(b)(1) of the Act, 21 U.S.C.section 360bbb-3(b)(1), unless the authorization is terminated  or revoked sooner.       Influenza A by PCR NEGATIVE NEGATIVE Final   Influenza B by PCR NEGATIVE NEGATIVE Final    Comment: (NOTE) The Xpert Xpress SARS-CoV-2/FLU/RSV plus assay is intended as an aid in the diagnosis of influenza from Nasopharyngeal swab specimens and should not be used as a sole basis for treatment. Nasal  washings and aspirates are unacceptable for Xpert Xpress SARS-CoV-2/FLU/RSV testing.  Fact Sheet for Patients: BloggerCourse.com  Fact Sheet for Healthcare Providers: SeriousBroker.it  This test is not yet approved or cleared by the [place] FDA and has been authorized for detection and/or diagnosis of SARS-CoV-2 by FDA under an Emergency Use Authorization (EUA). This EUA will remain in effect (meaning this test can be used) for the duration of the COVID-19 declaration under Section 564(b)(1) of the Act, 21 U.S.C. section 360bbb-3(b)(1), unless the authorization is terminated or revoked.  Performed at [facility], [address] [place], [place] [address]      Discharge  Instructions:   Discharge Instructions     Diet - low sodium heart healthy   Complete by: As directed    Increase activity slowly   Complete by: As directed       Allergies as of 08/13/2021   No Known Allergies      Medication List     TAKE these medications    albuterol 108 (90 Base) MCG/ACT inhaler Commonly known as: VENTOLIN HFA Inhale 1-2 puffs into the lungs every 6 (six) hours as needed for wheezing or shortness of breath.   budesonide-formoterol 160-4.5 MCG/ACT inhaler Commonly known as: Symbicort Inhale 2 puffs into the lungs in the morning and at bedtime.   pantoprazole 40 MG tablet Commonly known as: Protonix Take 1 tablet (40 mg total) by mouth daily.   predniSONE 50 MG tablet Commonly known as: DELTASONE Take 1 tablet (50 mg total) by mouth daily with breakfast. Start taking on: August 14, 2021        Follow-up Information     Claiborne Rigg, NP Follow up.   Specialty: Nurse Practitioner Why: DATE: OCT 17 , 2022 TIME : 2:30 PM Contact information: [address] [place] [address] [phone]                  Time coordinating discharge: 35 min  Signed:  Joseph Art DO  [facility] 08/13/2021, 11:20 AM

## 2021-08-13 NOTE — Progress Notes (Signed)
Patient complained of toothache to upper right tooth keeping him from sleeping. Has used his dental floss pick and toothbrush. Order for tylenol was obtained and given. Pt now resting and appears asleep. Pt remains in his own t shirt and jeans. Does not want telemetry or hospital gown, Other wise very pleasant and cooperative. Stated he will discharge tomorrow and get prescriptions for inhalers. Also states that he is going to work tomorrow at his shift approx 5 pm. States he does not want to miss work.

## 2021-08-25 ENCOUNTER — Inpatient Hospital Stay: Payer: Self-pay | Admitting: Nurse Practitioner

## 2021-08-29 ENCOUNTER — Other Ambulatory Visit (HOSPITAL_COMMUNITY): Payer: Self-pay

## 2021-10-18 ENCOUNTER — Emergency Department (HOSPITAL_COMMUNITY): Payer: Self-pay

## 2021-10-18 ENCOUNTER — Emergency Department (HOSPITAL_COMMUNITY)
Admission: EM | Admit: 2021-10-18 | Discharge: 2021-10-19 | Disposition: A | Discharge status: Home / Self Care | Payer: Self-pay | Attending: Emergency Medicine | Admitting: Emergency Medicine

## 2021-10-18 ENCOUNTER — Other Ambulatory Visit: Payer: Self-pay

## 2021-10-18 ENCOUNTER — Encounter (HOSPITAL_COMMUNITY): Payer: Self-pay

## 2021-10-18 ENCOUNTER — Ambulatory Visit (HOSPITAL_COMMUNITY)
Admission: EM | Admit: 2021-10-18 | Discharge: 2021-10-18 | Payer: Self-pay | Attending: Emergency Medicine | Admitting: Emergency Medicine

## 2021-10-18 DIAGNOSIS — Z20822 Contact with and (suspected) exposure to covid-19: Secondary | ICD-10-CM | POA: Insufficient documentation

## 2021-10-18 DIAGNOSIS — Z7951 Long term (current) use of inhaled steroids: Secondary | ICD-10-CM | POA: Insufficient documentation

## 2021-10-18 DIAGNOSIS — R0682 Tachypnea, not elsewhere classified: Secondary | ICD-10-CM | POA: Insufficient documentation

## 2021-10-18 DIAGNOSIS — J4541 Moderate persistent asthma with (acute) exacerbation: Secondary | ICD-10-CM

## 2021-10-18 DIAGNOSIS — Z87891 Personal history of nicotine dependence: Secondary | ICD-10-CM | POA: Insufficient documentation

## 2021-10-18 LAB — CBC WITH DIFFERENTIAL/PLATELET
Abs Immature Granulocytes: 0.05 10*3/uL (ref 0.00–0.07)
Basophils Absolute: 0.1 10*3/uL (ref 0.0–0.1)
Basophils Relative: 1 %
Eosinophils Absolute: 0.5 10*3/uL (ref 0.0–0.5)
Eosinophils Relative: 4 %
HCT: 46.8 % (ref 39.0–52.0)
Hemoglobin: 15.8 g/dL (ref 13.0–17.0)
Immature Granulocytes: 0 %
Lymphocytes Relative: 17 %
Lymphs Abs: 2.3 10*3/uL (ref 0.7–4.0)
MCH: 33.7 pg (ref 26.0–34.0)
MCHC: 33.8 g/dL (ref 30.0–36.0)
MCV: 99.8 fL (ref 80.0–100.0)
Monocytes Absolute: 1.1 10*3/uL — ABNORMAL HIGH (ref 0.1–1.0)
Monocytes Relative: 8 %
Neutro Abs: 9.7 10*3/uL — ABNORMAL HIGH (ref 1.7–7.7)
Neutrophils Relative %: 70 %
Platelets: 277 10*3/uL (ref 150–400)
RBC: 4.69 MIL/uL (ref 4.22–5.81)
RDW: 12.3 % (ref 11.5–15.5)
WBC: 13.8 10*3/uL — ABNORMAL HIGH (ref 4.0–10.5)
nRBC: 0 % (ref 0.0–0.2)

## 2021-10-18 LAB — BASIC METABOLIC PANEL
Anion gap: 6 (ref 5–15)
BUN: 8 mg/dL (ref 6–20)
CO2: 24 mmol/L (ref 22–32)
Calcium: 8.8 mg/dL — ABNORMAL LOW (ref 8.9–10.3)
Chloride: 106 mmol/L (ref 98–111)
Creatinine, Ser: 1.1 mg/dL (ref 0.61–1.24)
GFR, Estimated: >60 mL/min (ref >60)
Glucose, Bld: 121 mg/dL — ABNORMAL HIGH (ref 70–99)
Potassium: 3.8 mmol/L (ref 3.5–5.1)
Sodium: 136 mmol/L (ref 135–145)

## 2021-10-18 MED ORDER — AEROCHAMBER PLUS FLO-VU LARGE MISC
Status: AC
  Filled 2021-10-18: qty 1

## 2021-10-18 MED ORDER — ALBUTEROL SULFATE HFA 108 (90 BASE) MCG/ACT IN AERS
INHALATION_SPRAY | RESPIRATORY_TRACT | Status: AC
  Filled 2021-10-18: qty 6.7

## 2021-10-18 MED ORDER — ALBUTEROL SULFATE HFA 108 (90 BASE) MCG/ACT IN AERS
4.0000 | INHALATION_SPRAY | Freq: Once | RESPIRATORY_TRACT | Status: AC
  Administered 2021-10-18: 4 via RESPIRATORY_TRACT

## 2021-10-18 NOTE — ED Provider Notes (Signed)
Emergency Medicine Provider Triage Evaluation Note  Fabrice Dyal Selleck , a 45 y.o. male  was evaluated in triage.  Pt complains of shortness of breath.  This started this morning.  He reports occasional dry cough.  No other complaints. He used to smoke.  No known sick contacts.He is out of his inhalers.  Urgent care gave him 4 puffs of albuterol PTA.   Review of Systems  Positive: Heying of breath, wheezing Negative: Fevers  Physical Exam  BP 120/84   Pulse 92   Temp 98.6 F (37 C) (Oral)   Resp 14   Ht 5\' 9"  (1.753 m)   Wt 68.1 kg   SpO2 97%   BMI 22.17 kg/m  Gen:   Awake, no distress   Resp:  Diffuse wheezes with diminished breath sounds bilaterally. MSK:   Moves extremities without difficulty  Other:  Normal speech.   Medical Decision Making  Medically screening exam initiated at 5:31 PM.  Appropriate orders placed.  Stockton Nunley Raffield was informed that the remainder of the evaluation will be completed by another provider, this initial triage assessment does not replace that evaluation, and the importance of remaining in the ED until their evaluation is complete.  1735 Patient took an additional three puffs offs his inhaler.   As patient has to get admitted the last time he was like this will order labs.    Alfredo Bach 10/18/21 1741    14/10/22, MD 10/20/21 [phone]

## 2021-10-18 NOTE — ED Notes (Addendum)
Pt did 4 puffs on Albuterol inhaler that [facility] gave him as instructed by PA

## 2021-10-18 NOTE — ED Triage Notes (Signed)
Pt presents with shortness and wheezing since waking up this morning.

## 2021-10-18 NOTE — ED Notes (Signed)
Patient is being discharged from the Urgent Care and sent to the Emergency Department via personal vehicle with family  . Per provider Pia Mau, patient is in need of higher level of care due to asthma exacerbation. Patient is aware and verbalizes understanding of plan of care.  Vitals:   10/18/21 1511  BP: (!) 169/105  Pulse: (!) 101  Resp: (!) 24  SpO2: 90%

## 2021-10-18 NOTE — ED Triage Notes (Signed)
Pt arrived via POV from [facility]. Pt c/o SOB, that started this morning. Pt eupneic, unlabored breathing at present. Pt able to speak in full sentences.

## 2021-10-19 LAB — RESP PANEL BY RT-PCR (FLU A&B, COVID) ARPGX2
Influenza A by PCR: NEGATIVE
Influenza B by PCR: NEGATIVE
SARS Coronavirus 2 by RT PCR: NEGATIVE

## 2021-10-19 MED ORDER — ALBUTEROL SULFATE (2.5 MG/3ML) 0.083% IN NEBU
10.0000 mg/h | INHALATION_SOLUTION | RESPIRATORY_TRACT | Status: DC
  Administered 2021-10-19: 10 mg/h via RESPIRATORY_TRACT
  Filled 2021-10-19: qty 12

## 2021-10-19 MED ORDER — BUDESONIDE-FORMOTEROL FUMARATE 160-4.5 MCG/ACT IN AERO: 2.0000 | INHALATION_SPRAY | Freq: Two times a day (BID) | RESPIRATORY_TRACT | 2 refills | Status: DC

## 2021-10-19 MED ORDER — IPRATROPIUM BROMIDE HFA 17 MCG/ACT IN AERS
2.0000 | INHALATION_SPRAY | Freq: Once | RESPIRATORY_TRACT | Status: AC
  Administered 2021-10-19: 2 via RESPIRATORY_TRACT
  Filled 2021-10-19: qty 12.9

## 2021-10-19 MED ORDER — METHYLPREDNISOLONE SODIUM SUCC 125 MG IJ SOLR
125.0000 mg | Freq: Once | INTRAMUSCULAR | Status: AC
  Administered 2021-10-19: 125 mg via INTRAVENOUS
  Filled 2021-10-19: qty 2

## 2021-10-19 MED ORDER — PREDNISONE 10 MG PO TABS: 40.0000 mg | ORAL_TABLET | Freq: Every day | ORAL | 0 refills | Status: AC

## 2021-10-19 MED ORDER — ALBUTEROL SULFATE (2.5 MG/3ML) 0.083% IN NEBU
2.5000 mg | INHALATION_SOLUTION | Freq: Once | RESPIRATORY_TRACT | Status: AC
  Administered 2021-10-19: 2.5 mg via RESPIRATORY_TRACT
  Filled 2021-10-19: qty 3

## 2021-10-19 NOTE — ED Provider Notes (Signed)
[facility] EMERGENCY DEPARTMENT Provider Note  CSN: [id number] Arrival date & time: 10/18/21 1530  Chief Complaint(s) Shortness of Breath  HPI Jimmy Herring is a 45 y.o. male with a past medical history listed below who presents to the emergency department with 1 day of gradually worsening shortness of breath.  Consistent with prior asthma exacerbation.  Patient reports longtime smoker but quit a month ago.  Denies any recent fevers or infections.  Endorses a dry cough.  No nausea or vomiting.  No chest pain.   Patient has tried taking albuterol inhalers without relief.    The history is provided by the patient.   Past Medical History Past Medical History:  Diagnosis Date   Asthma    Patient Active Problem List   Diagnosis Date Noted   Asthma 08/12/2021   Asthma exacerbation 08/11/2021   Alcohol use 08/11/2021   Cocaine use 08/11/2021   Home Medication(s) Prior to Admission medications   Medication Sig Start Date End Date Taking? Authorizing Provider  predniSONE (DELTASONE) 10 MG tablet Take 4 tablets (40 mg total) by mouth daily for 4 days. 10/19/21 10/23/21 Yes Tedra Coppernoll, Amadeo Garnet, MD  albuterol (VENTOLIN HFA) 108 (90 Base) MCG/ACT inhaler Inhale 1-2 puffs into the lungs every 6 (six) hours as needed for wheezing or shortness of breath. Patient not taking: Reported on 10/19/2021 08/13/21   Joseph Art, DO  budesonide-formoterol [facility]) 160-4.5 MCG/ACT inhaler Inhale 2 puffs into the lungs in the morning and at bedtime. Patient not taking: Reported on 10/19/2021 08/13/21   Joseph Art, DO  ibuprofen (IBU) 600 MG tablet Take 1 tablet (600 mg total) by mouth every 8 (eight) hours as needed for mild pain. Patient not taking: Reported on 10/19/2021 08/13/21   Joseph Art, DO  pantoprazole (PROTONIX) 40 MG tablet Take 1 tablet (40 mg total) by mouth daily. Patient not taking: Reported on 10/19/2021 08/13/21 08/13/22  Joseph Art, DO                                                                                                                                     Past Surgical History History reviewed. No pertinent surgical history. Family History Family History  Problem Relation Age of Onset   COPD Sister    Asthma Sister     Social History Social History   Tobacco Use   Smoking status: Former    Types: Cigarettes   Smokeless tobacco: Never  Substance Use Topics   Alcohol use: Yes    Comment: Drinks a sixpack of beer daily   Drug use: Not Currently    Types: Cocaine   Allergies Patient has no known allergies.  Review of Systems Review of Systems All other systems are reviewed and are negative for acute change except as noted in the HPI  Physical Exam Vital Signs  I have reviewed the triage vital signs BP 129/77 (  BP Location: Right Arm)   Pulse 88   Temp 98.8 F (37.1 C) (Oral)   Resp 19   Ht 5\' 9"  (1.753 m)   Wt 68.1 kg   SpO2 98%   BMI 22.17 kg/m   Physical Exam Vitals reviewed.  Constitutional:      General: He is not in acute distress.    Appearance: He is well-developed. He is not diaphoretic.  HENT:     Head: Normocephalic and atraumatic.     Nose: Nose normal.  Eyes:     General: No scleral icterus.       Right eye: No discharge.        Left eye: No discharge.     Conjunctiva/sclera: Conjunctivae normal.     Pupils: Pupils are equal, round, and reactive to light.  Cardiovascular:     Rate and Rhythm: Normal rate and regular rhythm.     Heart sounds: No murmur heard.   No friction rub. No gallop.  Pulmonary:     Effort: Tachypnea, accessory muscle usage, prolonged expiration and respiratory distress present.     Breath sounds: No stridor. Wheezing (diffused insp and exp) present. No rales.  Abdominal:     General: There is no distension.     Palpations: Abdomen is soft.     Tenderness: There is no abdominal tenderness.  Musculoskeletal:        General: No tenderness.      Cervical back: Normal range of motion and neck supple.  Skin:    General: Skin is warm and dry.     Findings: No erythema or rash.  Neurological:     Mental Status: He is alert and oriented to person, place, and time.    ED Results and Treatments Labs (all labs ordered are listed, but only abnormal results are displayed) Labs Reviewed  CBC WITH DIFFERENTIAL/PLATELET - Abnormal; Notable for the following components:      Result Value   WBC 13.8 (*)    Neutro Abs 9.7 (*)    Monocytes Absolute 1.1 (*)    All other components within normal limits  BASIC METABOLIC PANEL - Abnormal; Notable for the following components:   Glucose, Bld 121 (*)    Calcium 8.8 (*)    All other components within normal limits  RESP PANEL BY RT-PCR (FLU A&B, COVID) [id number]                                                                                                                         EKG  EKG Interpretation  Date/Time:    Ventricular Rate:    PR Interval:    QRS Duration:   QT Interval:    QTC Calculation:   R Axis:     Text Interpretation:         Radiology DG Chest 2 View  Result Date: 10/18/2021 CLINICAL DATA:  Wheezing EXAM: CHEST - 2 VIEW COMPARISON:  08/11/2021 FINDINGS: The heart size and mediastinal contours  are within normal limits. Both lungs are clear. The visualized skeletal structures are unremarkable. IMPRESSION: Normal study. Electronically Signed   By: Charlett Nose M.D.   On: 10/18/2021 18:11    Pertinent labs & imaging results that were available during my care of the patient were reviewed by me and considered in my medical decision making (see MDM for details).  Medications Ordered in ED Medications  albuterol (PROVENTIL) (2.5 MG/3ML) 0.083% nebulizer solution (10 mg/hr Nebulization New Bag/Given 10/19/21 0024)  ipratropium (ATROVENT HFA) inhaler 2 puff (2 puffs Inhalation Given 10/19/21 0257)  methylPREDNISolone sodium succinate (SOLU-MEDROL) 125 mg/2 mL injection 125  mg (125 mg Intravenous Given 10/19/21 0034)  albuterol (PROVENTIL) (2.5 MG/3ML) 0.083% nebulizer solution 2.5 mg (2.5 mg Nebulization Given 10/19/21 0212)                                                                                                                                     Procedures .1-3 Lead EKG Interpretation Performed by: Nira Conn, MD Authorized by: Nira Conn, MD     Interpretation: normal     ECG rate:  89   ECG rate assessment: normal     Rhythm: sinus rhythm     Ectopy: none     Conduction: normal   .Critical Care Performed by: Nira Conn, MD Authorized by: Nira Conn, MD   Critical care provider statement:    Critical care time (minutes):  45   Critical care time was exclusive of:  Separately billable procedures and treating other patients   Critical care was necessary to treat or prevent imminent or life-threatening deterioration of the following conditions:  Respiratory failure   Critical care was time spent personally by me on the following activities:  Development of treatment plan with patient or surrogate, discussions with consultants, evaluation of patient's response to treatment, examination of patient, obtaining history from patient or surrogate, review of old charts, re-evaluation of patient's condition, pulse oximetry, ordering and review of radiographic studies, ordering and review of laboratory studies and ordering and performing treatments and interventions  (including critical care time)  Medical Decision Making / ED Course I have reviewed the nursing notes for this encounter and the patient's prior records (if available in EHR or on provided paperwork).  Jimmy Herring was evaluated in Emergency Department on 10/19/2021 for the symptoms described in the history of present illness. He was evaluated in the context of the global COVID-19 pandemic, which necessitated consideration that the patient  might be at risk for infection with the SARS-CoV-2 virus that causes COVID-19. Institutional protocols and algorithms that pertain to the evaluation of patients at risk for COVID-19 are in a state of rapid change based on information released by regulatory bodies including the CDC and federal and state organizations. These policies and algorithms were followed during the patient's care in the ED.     Shortness of breath  consistent with asthma/COPD exacerbation. Satting well on room air but does have increased work of breathing. Patient initially seen in the MSE process and had labs and imaging obtained.  Pertinent labs & imaging results that were available during my care of the patient were reviewed by me and considered in my medical decision making:  CBC with leukocytosis but no anemia.  No significant electrolyte derangements or renal insufficiency.. Chest x-ray without evidence of pneumonia, pneumothorax or pulmonary edema.  Will provide patient with continuous DuoNeb and IV Solu-Medrol given his significantly increased work of breathing.  Increased work of breathing and lung sounds improved following treatment. Monitor for additional several hours. Satting well on room air. No increased work of breathing. Was sleeping comfortably.  Patient does not require admission at this time.  Feel he is stable for continued outpatient management.  Final Clinical Impression(s) / ED Diagnoses Final diagnoses:  Moderate persistent asthma with acute exacerbation   The patient appears reasonably screened and/or stabilized for discharge and I doubt any other medical condition or other [facility] requiring further screening, evaluation, or treatment in the ED at this time prior to discharge. Safe for discharge with strict return precautions.  Disposition: Discharge  Condition: Good  I have discussed the results, Dx and Tx plan with the patient/family who expressed understanding and agree(s) with the plan.  Discharge instructions discussed at length. The patient/family was given strict return precautions who verbalized understanding of the instructions. No further questions at time of discharge.    ED Discharge Orders          Ordered    predniSONE (DELTASONE) 10 MG tablet  Daily        10/19/21 0328             Follow Up: Primary care provider  Call  to schedule an appointment for close follow up     This chart was dictated using voice recognition software.  Despite best efforts to proofread,  errors can occur which can change the documentation meaning.    Nira Conn, MD 10/19/21 0330

## 2021-10-19 NOTE — ED Notes (Signed)
Patient provided with warm blankets and a pillow for comfort

## 2021-10-19 NOTE — ED Notes (Signed)
Patient provided with sandwich and orange juice prior to d/c.  Reports "I've been here all night and haven't been able to eat.  I am starving."  Patient requesting to have Symbicort Rx sent to pharmacy when attempting to d/c.  When informed that Rx was sent to pharmacy, patient requesting to have a Symbicort to take home.  States "what if I cant afford it when I leave here?"  Does not want to be discharged until "I get what I need.  I know my body."  MD and charge RN made aware

## 2021-10-19 NOTE — Discharge Instructions (Addendum)
Albuterol: Take 4 puffs every 4 hours scheduled for the next 24 hours while awake.  Starting tomorrow, you can take 2 to 4 puffs every 4-6 hours as needed for shortness of breath.

## 2022-03-30 ENCOUNTER — Ambulatory Visit
Admission: EM | Admit: 2022-03-30 | Discharge: 2022-03-30 | Disposition: A | Discharge status: Home / Self Care | Payer: Self-pay | Attending: Urgent Care | Admitting: Urgent Care

## 2022-03-30 ENCOUNTER — Encounter: Payer: Self-pay | Admitting: Emergency Medicine

## 2022-03-30 DIAGNOSIS — R062 Wheezing: Secondary | ICD-10-CM

## 2022-03-30 DIAGNOSIS — R0602 Shortness of breath: Secondary | ICD-10-CM

## 2022-03-30 DIAGNOSIS — J4541 Moderate persistent asthma with (acute) exacerbation: Secondary | ICD-10-CM

## 2022-03-30 MED ORDER — PREDNISONE 50 MG PO TABS: 50.0000 mg | ORAL_TABLET | Freq: Every day | ORAL | 0 refills | Status: DC

## 2022-03-30 MED ORDER — ALBUTEROL SULFATE (2.5 MG/3ML) 0.083% IN NEBU: 2.5000 mg | INHALATION_SOLUTION | Freq: Four times a day (QID) | RESPIRATORY_TRACT | 12 refills | Status: DC | PRN

## 2022-03-30 MED ORDER — ALBUTEROL SULFATE HFA 108 (90 BASE) MCG/ACT IN AERS
2.0000 | INHALATION_SPRAY | Freq: Once | RESPIRATORY_TRACT | Status: AC
  Administered 2022-03-30: 2 via RESPIRATORY_TRACT

## 2022-03-30 MED ORDER — BUDESONIDE-FORMOTEROL FUMARATE 160-4.5 MCG/ACT IN AERO: 2.0000 | INHALATION_SPRAY | Freq: Two times a day (BID) | RESPIRATORY_TRACT | 2 refills | Status: DC

## 2022-03-30 MED ORDER — ALBUTEROL SULFATE (2.5 MG/3ML) 0.083% IN NEBU
2.5000 mg | INHALATION_SOLUTION | Freq: Once | RESPIRATORY_TRACT | Status: AC
  Administered 2022-03-30: 2.5 mg via RESPIRATORY_TRACT

## 2022-03-30 NOTE — ED Provider Notes (Signed)
[facility]   MRN: [id number] DOB: 06-15-1976  Subjective:   Jimmy Herring is a 46 y.o. male presenting for 1 day history of acute onset coughing, shortness of breath, wheezing.  Ran out of his Symbicort yesterday.  He also needs refills on his albuterol inhaler, nebulized albuterol and tubing, mask.  Patient is a smoker still.  Has had COVID-19 twice, last episode was a year ago.   Current Facility-Administered Medications:    albuterol (PROVENTIL) (2.5 MG/3ML) 0.083% nebulizer solution 2.5 mg, 2.5 mg, Nebulization, Once, Wallis Bamberg, PA-C  Current Outpatient Medications:    albuterol (VENTOLIN HFA) 108 (90 Base) MCG/ACT inhaler, Inhale 1-2 puffs into the lungs every 6 (six) hours as needed for wheezing or shortness of breath. (Patient not taking: Reported on 10/19/2021), Disp: 18 g, Rfl: 2   budesonide-formoterol (SYMBICORT) 160-4.5 MCG/ACT inhaler, Inhale 2 puffs into the lungs in the morning and at bedtime., Disp: 10.2 g, Rfl: 2   ibuprofen (IBU) 600 MG tablet, Take 1 tablet (600 mg total) by mouth every 8 (eight) hours as needed for mild pain. (Patient not taking: Reported on 10/19/2021), Disp: 8 tablet, Rfl: 0   pantoprazole (PROTONIX) 40 MG tablet, Take 1 tablet (40 mg total) by mouth daily. (Patient not taking: Reported on 10/19/2021), Disp: 7 tablet, Rfl: 0   No Known Allergies  Past Medical History:  Diagnosis Date   Asthma      History reviewed. No pertinent surgical history.  Family History  Problem Relation Age of Onset   COPD Sister    Asthma Sister     Social History   Tobacco Use   Smoking status: Every Day    Packs/day: 0.50    Types: Cigarettes   Smokeless tobacco: Never  Substance Use Topics   Alcohol use: Yes    Comment: Drinks a sixpack of beer daily   Drug use: Not Currently    Types: Cocaine    ROS   Objective:   Vitals: BP 111/70   Pulse (!) 122   Temp 99.5 F (37.5 C)   SpO2 98%   Physical  Exam Constitutional:      General: He is not in acute distress.    Appearance: Normal appearance. He is well-developed. He is not ill-appearing, toxic-appearing or diaphoretic.  HENT:     Head: Normocephalic and atraumatic.     Right Ear: External ear normal.     Left Ear: External ear normal.     Nose: Nose normal.     Mouth/Throat:     Mouth: Mucous membranes are moist.     Comments: No cyanotic or pursed lips. Eyes:     General: No scleral icterus.       Right eye: No discharge.        Left eye: No discharge.     Extraocular Movements: Extraocular movements intact.  Cardiovascular:     Rate and Rhythm: Normal rate and regular rhythm.     Heart sounds: Normal heart sounds. No murmur heard.   No friction rub. No gallop.  Pulmonary:     Effort: Pulmonary effort is normal. No respiratory distress.     Breath sounds: No stridor. Wheezing and rhonchi present. No rales.     Comments: No use of accessory muscles, respiratory distress. Neurological:     Mental Status: He is alert and oriented to person, place, and time.  Psychiatric:        Mood and Affect: Mood normal.  Behavior: Behavior normal.        Thought Content: Thought content normal.    Patient given nebulized albuterol at a dose of 2.5 mg.  Assessment and Plan :   PDMP not reviewed this encounter.  1. Moderate persistent asthma with acute exacerbation   2. Wheezing   3. Shortness of breath    Unfortunately, we do not have Solu-Medrol in stock as it is on backorder.  We did provide him with nebulized albuterol which improved his breathing symptoms.  He is having an exacerbation and therefore recommended an oral prednisone course.  We have provided him with a new mask, tubing for his nebulizer.  I refilled his albuterol inhaler in clinic and sent refills for his nebulized albuterol.  Recommend supportive care otherwise.  Maintain strict ER precautions.  Will defer ER visit for now as he is not hypoxic or in  respiratory distress.  COVID-19 testing is pending. Counseled patient on potential for adverse effects with medications prescribed/recommended today, ER and return-to-clinic precautions discussed, patient verbalized understanding.    Wallis Bamberg, [place] 03/31/22 [phone]

## 2022-03-30 NOTE — ED Triage Notes (Signed)
Pt here with asthma exacerbation and cough since last night. Pt does not have his inhaler anymore and his tubing for nebulizer is broken

## 2022-04-01 LAB — NOVEL CORONAVIRUS, NAA: SARS-CoV-2, NAA: NOT DETECTED

## 2023-01-19 IMAGING — DX DG CHEST 2V
2 series · 2 of 2 positions shown · non-contrast
Comparison: 08/11/2021

CLINICAL DATA: Wheezing

EXAM:
CHEST - 2 VIEW

[chest pa]
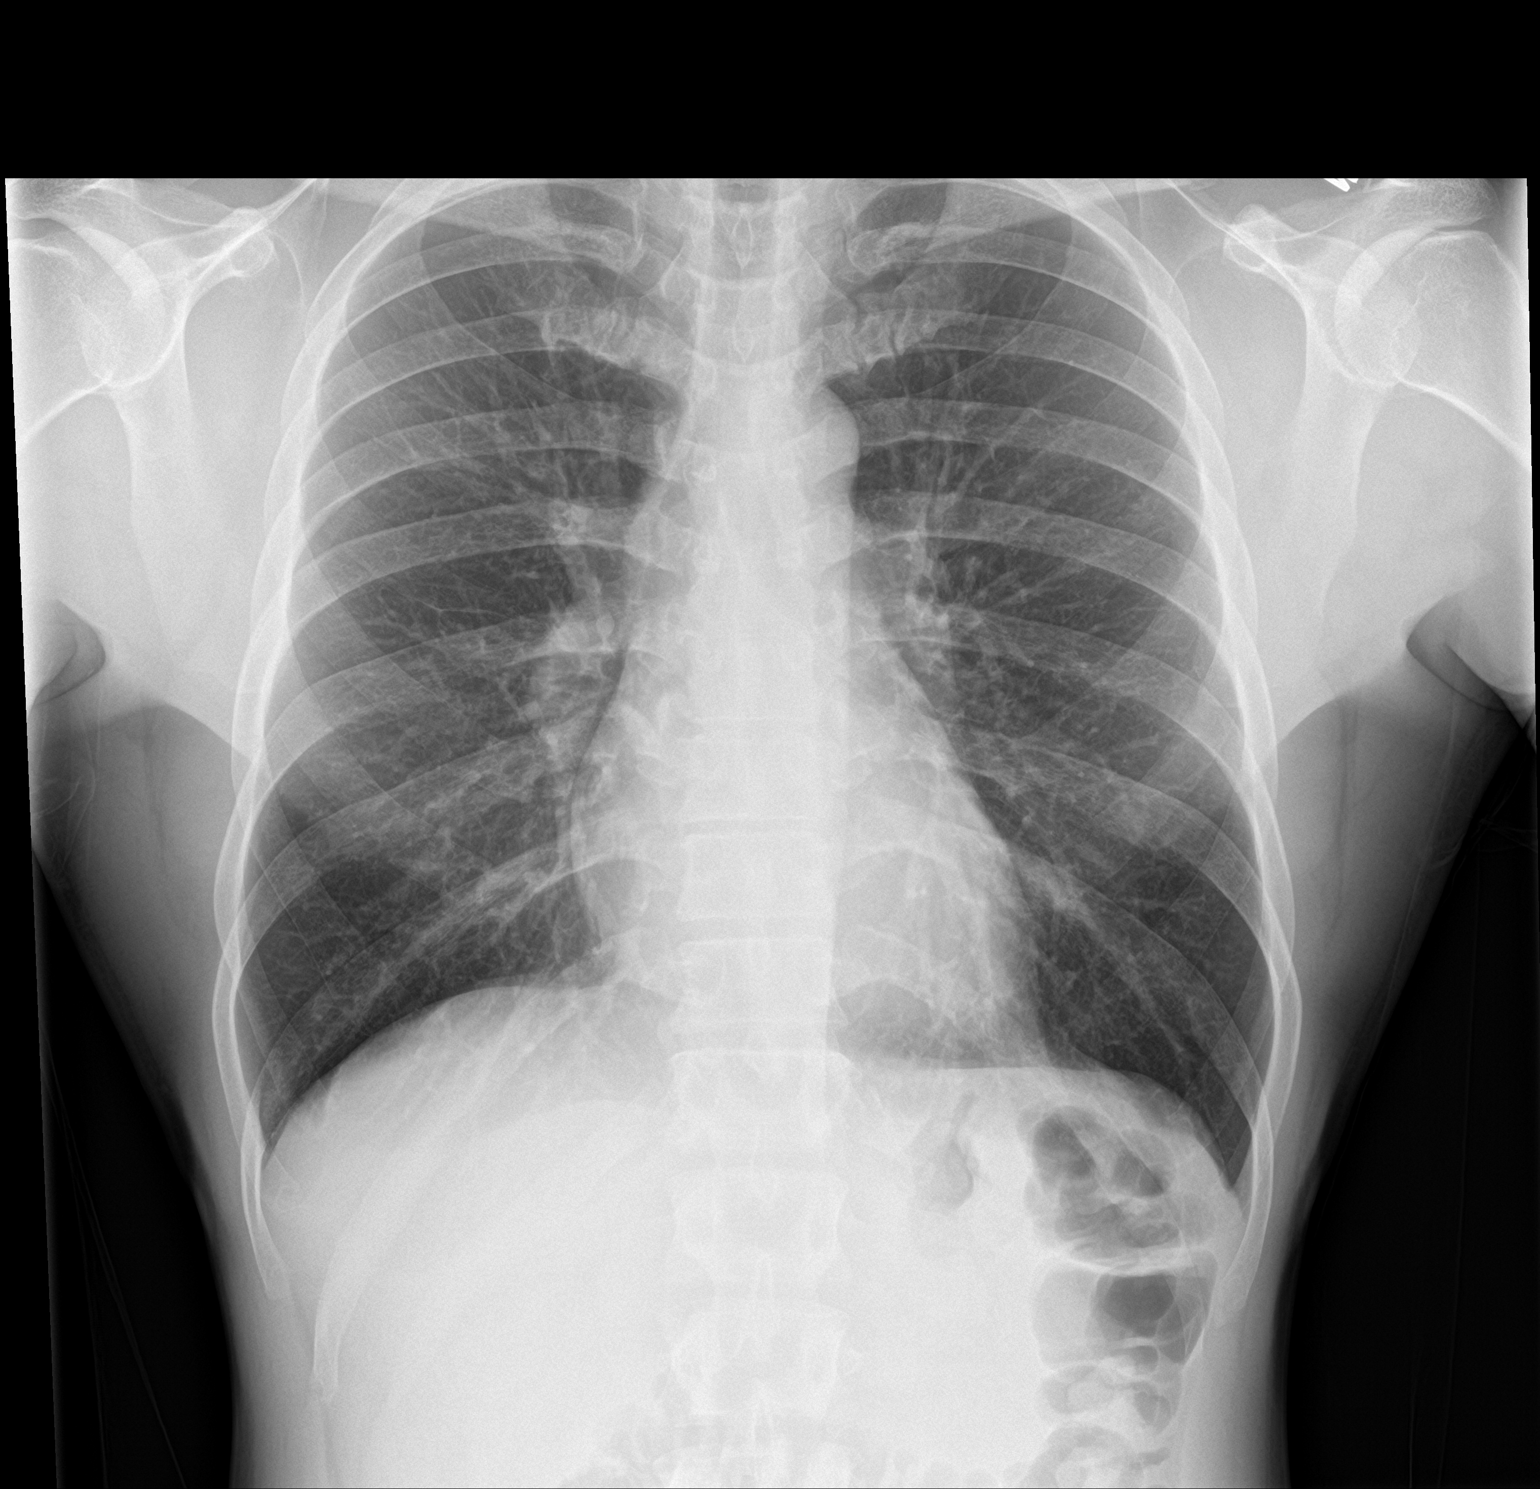

[chest lat]
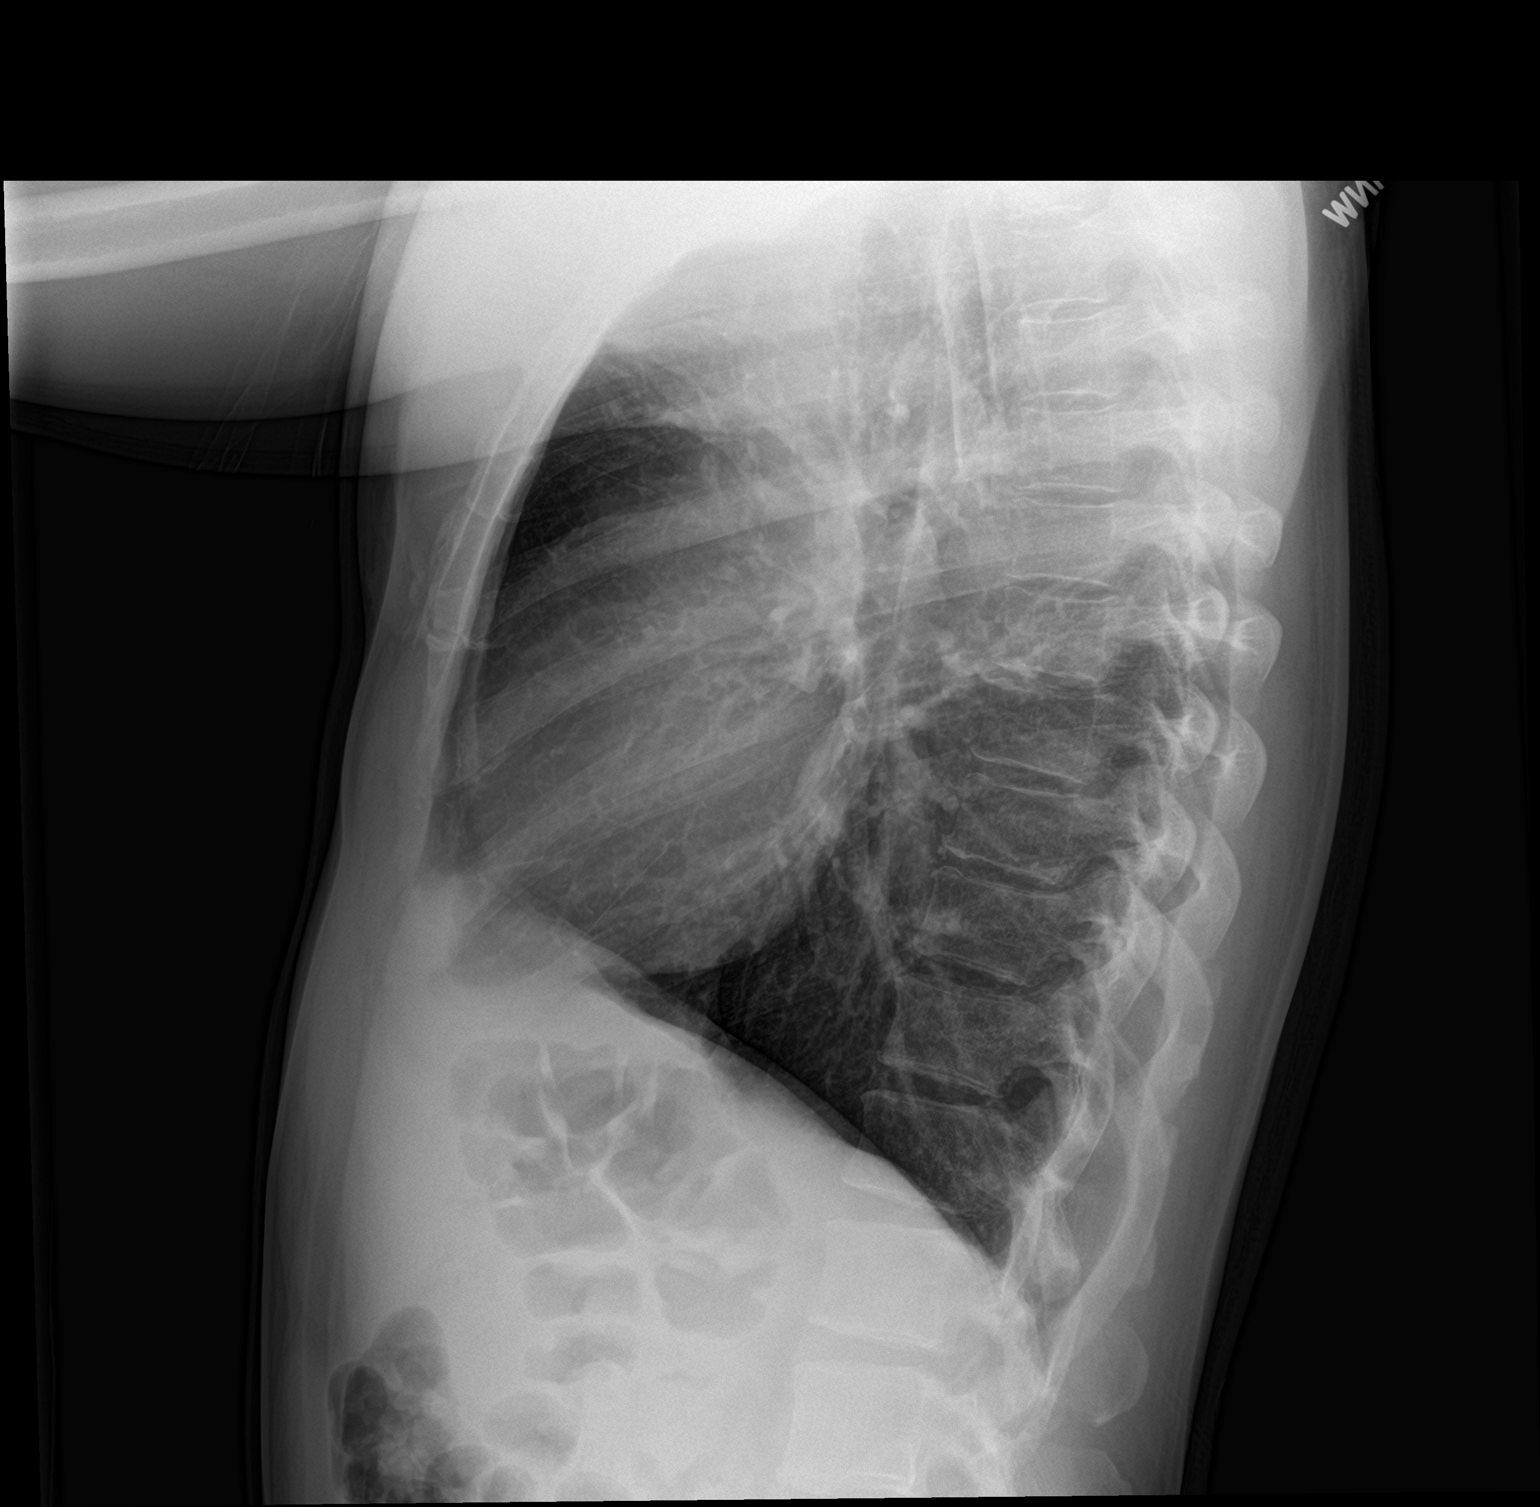

[2 of 2 positions shown; findings below may reference images not displayed]

FINDINGS: The heart size and mediastinal contours are within normal limits.
Both lungs are clear. The visualized skeletal structures are
unremarkable.
IMPRESSION: Normal study.

## 2023-05-06 ENCOUNTER — Emergency Department (HOSPITAL_BASED_OUTPATIENT_CLINIC_OR_DEPARTMENT_OTHER): Payer: Self-pay

## 2023-05-06 ENCOUNTER — Other Ambulatory Visit: Payer: Self-pay

## 2023-05-06 ENCOUNTER — Encounter (HOSPITAL_BASED_OUTPATIENT_CLINIC_OR_DEPARTMENT_OTHER): Payer: Self-pay

## 2023-05-06 ENCOUNTER — Inpatient Hospital Stay (HOSPITAL_BASED_OUTPATIENT_CLINIC_OR_DEPARTMENT_OTHER)
Admission: EM | Admit: 2023-05-06 | Discharge: 2023-05-09 | DRG: 202 | Disposition: A | Discharge status: Home / Self Care | Payer: Self-pay | Attending: Internal Medicine | Admitting: Internal Medicine

## 2023-05-06 DIAGNOSIS — Z720 Tobacco use: Secondary | ICD-10-CM | POA: Diagnosis present

## 2023-05-06 DIAGNOSIS — Z7951 Long term (current) use of inhaled steroids: Secondary | ICD-10-CM

## 2023-05-06 DIAGNOSIS — D72829 Elevated white blood cell count, unspecified: Secondary | ICD-10-CM | POA: Diagnosis present

## 2023-05-06 DIAGNOSIS — J45901 Unspecified asthma with (acute) exacerbation: Secondary | ICD-10-CM | POA: Diagnosis present

## 2023-05-06 DIAGNOSIS — E876 Hypokalemia: Secondary | ICD-10-CM | POA: Diagnosis present

## 2023-05-06 DIAGNOSIS — J4542 Moderate persistent asthma with status asthmaticus: Principal | ICD-10-CM | POA: Diagnosis present

## 2023-05-06 DIAGNOSIS — R739 Hyperglycemia, unspecified: Secondary | ICD-10-CM | POA: Diagnosis present

## 2023-05-06 DIAGNOSIS — J4551 Severe persistent asthma with (acute) exacerbation: Principal | ICD-10-CM

## 2023-05-06 DIAGNOSIS — R000 Tachycardia, unspecified: Secondary | ICD-10-CM | POA: Diagnosis present

## 2023-05-06 DIAGNOSIS — L309 Dermatitis, unspecified: Secondary | ICD-10-CM | POA: Diagnosis present

## 2023-05-06 DIAGNOSIS — Z825 Family history of asthma and other chronic lower respiratory diseases: Secondary | ICD-10-CM

## 2023-05-06 DIAGNOSIS — Z91141 Patient's other noncompliance with medication regimen due to financial hardship: Secondary | ICD-10-CM

## 2023-05-06 DIAGNOSIS — Z7952 Long term (current) use of systemic steroids: Secondary | ICD-10-CM

## 2023-05-06 DIAGNOSIS — Z5902 Unsheltered homelessness: Secondary | ICD-10-CM

## 2023-05-06 DIAGNOSIS — Z79899 Other long term (current) drug therapy: Secondary | ICD-10-CM

## 2023-05-06 DIAGNOSIS — R7303 Prediabetes: Secondary | ICD-10-CM | POA: Diagnosis present

## 2023-05-06 DIAGNOSIS — F17210 Nicotine dependence, cigarettes, uncomplicated: Secondary | ICD-10-CM | POA: Diagnosis present

## 2023-05-06 DIAGNOSIS — J4541 Moderate persistent asthma with (acute) exacerbation: Secondary | ICD-10-CM | POA: Diagnosis present

## 2023-05-06 MED ORDER — ALBUTEROL SULFATE (2.5 MG/3ML) 0.083% IN NEBU
INHALATION_SOLUTION | RESPIRATORY_TRACT | Status: AC
  Administered 2023-05-06: 2.5 mg via RESPIRATORY_TRACT
  Filled 2023-05-06: qty 12

## 2023-05-06 MED ORDER — ALBUTEROL (5 MG/ML) CONTINUOUS INHALATION SOLN
10.0000 mg/h | INHALATION_SOLUTION | RESPIRATORY_TRACT | Status: AC
  Administered 2023-05-06: 10 mg/h via RESPIRATORY_TRACT

## 2023-05-06 MED ORDER — EPINEPHRINE 0.3 MG/0.3ML IJ SOAJ
0.3000 mg | Freq: Once | INTRAMUSCULAR | Status: DC
  Filled 2023-05-06: qty 0.3

## 2023-05-06 MED ORDER — METHYLPREDNISOLONE SODIUM SUCC 125 MG IJ SOLR
125.0000 mg | Freq: Once | INTRAMUSCULAR | Status: AC
  Administered 2023-05-06: 125 mg via INTRAVENOUS
  Filled 2023-05-06: qty 2

## 2023-05-06 MED ORDER — MAGNESIUM SULFATE 2 GM/50ML IV SOLN
2.0000 g | Freq: Once | INTRAVENOUS | Status: AC
  Administered 2023-05-06: 2 g via INTRAVENOUS
  Filled 2023-05-06: qty 50

## 2023-05-06 MED ORDER — ALBUTEROL SULFATE (2.5 MG/3ML) 0.083% IN NEBU
2.5000 mg | INHALATION_SOLUTION | Freq: Once | RESPIRATORY_TRACT | Status: AC
  Administered 2023-05-06: 2.5 mg via RESPIRATORY_TRACT
  Filled 2023-05-06: qty 3

## 2023-05-06 MED ORDER — ALBUTEROL SULFATE (2.5 MG/3ML) 0.083% IN NEBU
2.5000 mg | INHALATION_SOLUTION | Freq: Once | RESPIRATORY_TRACT | Status: AC
Start: 2023-05-06 — End: 2023-05-06
  Filled 2023-05-06: qty 3

## 2023-05-06 MED ORDER — IPRATROPIUM-ALBUTEROL 0.5-2.5 (3) MG/3ML IN SOLN
3.0000 mL | Freq: Once | RESPIRATORY_TRACT | Status: AC
  Administered 2023-05-06: 3 mL via RESPIRATORY_TRACT
  Filled 2023-05-06: qty 3

## 2023-05-06 MED ORDER — ALBUTEROL (5 MG/ML) CONTINUOUS INHALATION SOLN: 10.0000 mg/h | INHALATION_SOLUTION | RESPIRATORY_TRACT | Status: DC

## 2023-05-06 MED ORDER — IPRATROPIUM-ALBUTEROL 0.5-2.5 (3) MG/3ML IN SOLN
3.0000 mL | Freq: Once | RESPIRATORY_TRACT | Status: AC
  Administered 2023-05-06: 3 mL via RESPIRATORY_TRACT

## 2023-05-06 NOTE — ED Provider Notes (Signed)
Care assumed pending admission for status asthmaticus.  Patient with dramatic improvement in his symptoms after initiating BiPAP.  Epinephrine was held at this time.  He did receive repeat nebs through his BiPAP circuit.  On examination work of breathing is improved but he does have persistent wheezing, fair air movement bilaterally.  Plan to admit for ongoing care.   Tilden Fossa, MD 05/07/23 [phone]

## 2023-05-06 NOTE — Progress Notes (Addendum)
Plan of Care Note for accepted transfer   Patient: Jimmy Herring MRN: [id number]   DOA: 05/06/2023  Facility requesting transfer: [facility]   Requesting Provider: Dr. Wallace Cullens   Reason for transfer: Asthma exacerbation   Facility course: 47 yr old man with asthma p/w 3 days of progressive SOB and wheezing. No acute findings on CXR. Not hypoxic but has increased WOB despite multiple DuoNebs, multiple albuterol treatments, IV steroids, and IV mag and is being placed on BiPAP.   Plan of care: The patient is accepted for admission to [facility] unit, at [facility].   Author: Briscoe Deutscher, MD 05/06/2023  Check [email or web address] for on-call coverage.  Nursing staff, Please call [facility] Admits & Consults System-Wide number on Amion as soon as patient's arrival, so appropriate admitting provider can evaluate the pt.

## 2023-05-06 NOTE — ED Triage Notes (Signed)
Pt with 1 day of SOB and dyspnea. Pt reports he has a hx. Pt states he last had an attack like these a few months ago. Pt thinks being out in the heat caused this; pt out of his medication.  No fevers at home.

## 2023-05-06 NOTE — ED Provider Notes (Addendum)
[facility] Provider Note   CSN: [id number] Arrival date & time: 05/06/23  2226     History  Chief Complaint  Patient presents with   Shortness of Breath    Jimmy Herring is a 47 y.o. male.  Patient is a 47 year old male with past medical history of asthma presenting for complaints of shortness of breath and wheezing.  Patient denies current inhaler use.  Patient states he is currently living in a tent and that sometimes the hot weather triggers his exacerbations.  He denies any sick contacts.  He denies any fevers, chills, coughing.  He denies any chest pain.  Denies current tobacco use.  The history is provided by the patient. No language interpreter was used.  Shortness of Breath Associated symptoms: wheezing   Associated symptoms: no abdominal pain, no chest pain, no cough, no ear pain, no fever, no rash, no sore throat and no vomiting        Home Medications Prior to Admission medications   Medication Sig Start Date End Date Taking? Authorizing Provider  albuterol (PROVENTIL) (2.5 MG/3ML) 0.083% nebulizer solution Take 3 mLs (2.5 mg total) by nebulization every 6 (six) hours as needed for wheezing or shortness of breath. 03/30/22   Wallis Bamberg, PA-C  albuterol (VENTOLIN HFA) 108 (90 Base) MCG/ACT inhaler Inhale 1-2 puffs into the lungs every 6 (six) hours as needed for wheezing or shortness of breath. Patient not taking: Reported on 10/19/2021 08/13/21   Joseph Art, DO  budesonide-formoterol [facility]) 160-4.5 MCG/ACT inhaler Inhale 2 puffs into the lungs in the morning and at bedtime. 03/30/22   Wallis Bamberg, PA-C  ibuprofen (IBU) 600 MG tablet Take 1 tablet (600 mg total) by mouth every 8 (eight) hours as needed for mild pain. Patient not taking: Reported on 10/19/2021 08/13/21   Joseph Art, DO  pantoprazole (PROTONIX) 40 MG tablet Take 1 tablet (40 mg total) by mouth daily. Patient not taking: Reported on 10/19/2021  08/13/21 08/13/22  Joseph Art, DO  predniSONE (DELTASONE) 50 MG tablet Take 1 tablet (50 mg total) by mouth daily with breakfast. 03/30/22   Wallis Bamberg, PA-C      Allergies    Patient has no known allergies.    Review of Systems   Review of Systems  Constitutional:  Negative for chills and fever.  HENT:  Negative for ear pain and sore throat.   Eyes:  Negative for pain and visual disturbance.  Respiratory:  Positive for shortness of breath and wheezing. Negative for cough.   Cardiovascular:  Negative for chest pain and palpitations.  Gastrointestinal:  Negative for abdominal pain and vomiting.  Genitourinary:  Negative for dysuria and hematuria.  Musculoskeletal:  Negative for arthralgias and back pain.  Skin:  Negative for color change and rash.  Neurological:  Negative for seizures and syncope.  All other systems reviewed and are negative.   Physical Exam Updated Vital Signs BP 114/66 (BP Location: Left Arm)   Pulse (!) 106   Temp 97.6 F (36.4 C) (Oral)   Resp (!) 28   Ht 5\' 9"  (1.753 m)   Wt 72.6 kg   SpO2 98%   BMI 23.63 kg/m  Physical Exam Vitals and nursing note reviewed.  Constitutional:      General: He is not in acute distress.    Appearance: He is well-developed.  HENT:     Head: Normocephalic and atraumatic.  Eyes:     Conjunctiva/sclera: Conjunctivae  normal.  Cardiovascular:     Rate and Rhythm: Regular rhythm. Tachycardia present.     Heart sounds: No murmur heard. Pulmonary:     Effort: Tachypnea and respiratory distress present.     Breath sounds: Examination of the right-upper field reveals wheezing. Examination of the left-upper field reveals wheezing. Examination of the right-middle field reveals wheezing. Examination of the left-middle field reveals wheezing. Examination of the right-lower field reveals wheezing. Examination of the left-lower field reveals wheezing. Wheezing present.  Abdominal:     Palpations: Abdomen is soft.     Tenderness:  There is no abdominal tenderness.  Musculoskeletal:        General: No swelling.     Cervical back: Neck supple.  Skin:    General: Skin is warm and dry.     Capillary Refill: Capillary refill takes less than 2 seconds.  Neurological:     Mental Status: He is alert.  Psychiatric:        Mood and Affect: Mood normal.     ED Results / Procedures / Treatments   Labs (all labs ordered are listed, but only abnormal results are displayed) Labs Reviewed  CBC WITH DIFFERENTIAL/PLATELET  BASIC METABOLIC PANEL  LACTIC ACID, PLASMA  LACTIC ACID, PLASMA    EKG None  Radiology DG Chest Portable 1 View  Result Date: 05/06/2023 CLINICAL DATA:  Shortness of breath.  History of asthma. EXAM: PORTABLE CHEST 1 VIEW COMPARISON:  PA and lateral 10/18/2021 FINDINGS: The heart size and mediastinal contours are within normal limits. Both lungs are clear. The visualized skeletal structures are unremarkable. IMPRESSION: No active disease.  Stable chest. Electronically Signed   By: Almira Bar M.D.   On: 05/06/2023 22:56    Procedures .Critical Care  Performed by: Franne Forts, DO Authorized by: Franne Forts, DO   Critical care provider statement:    Critical care time (minutes):  76   Critical care was necessary to treat or prevent imminent or life-threatening deterioration of the following conditions:  Respiratory failure   Critical care was time spent personally by me on the following activities:  Development of treatment plan with patient or surrogate, discussions with consultants, evaluation of patient's response to treatment, examination of patient, ordering and review of laboratory studies, ordering and review of radiographic studies, ordering and performing treatments and interventions, pulse oximetry, re-evaluation of patient's condition and review of old charts   Care discussed with: admitting provider       Medications Ordered in ED Medications  albuterol (PROVENTIL,VENTOLIN)  solution continuous neb (10 mg/hr Nebulization New Bag/Given 05/06/23 2307)  ipratropium-albuterol (DUONEB) 0.5-2.5 (3) MG/3ML nebulizer solution 3 mL (3 mLs Nebulization Given 05/06/23 2236)  albuterol (PROVENTIL) (2.5 MG/3ML) 0.083% nebulizer solution 2.5 mg (2.5 mg Nebulization Given 05/06/23 2236)  albuterol (PROVENTIL) (2.5 MG/3ML) 0.083% nebulizer solution 2.5 mg (2.5 mg Nebulization Given 05/06/23 2256)  methylPREDNISolone sodium succinate (SOLU-MEDROL) 125 mg/2 mL injection 125 mg (125 mg Intravenous Given 05/06/23 2250)  ipratropium-albuterol (DUONEB) 0.5-2.5 (3) MG/3ML nebulizer solution 3 mL (3 mLs Nebulization Given 05/06/23 2237)  magnesium sulfate IVPB 2 g 50 mL (0 g Intravenous Stopped 05/06/23 2318)    ED Course/ Medical Decision Making/ A&P                             Medical Decision Making Amount and/or Complexity of Data Reviewed Labs: ordered. Radiology: ordered.  Risk Prescription drug management. Decision regarding hospitalization.  40:40 PM 47 year old male with past medical history of asthma presenting for complaints of shortness of breath and wheezing.  Patient is alert and oriented x 3, tachycardic, tachypneic, no hypoxia, wheezing in all lung fields.  Continuous DuoNebs ordered.  Stable chest x-ray.  Patient reevaluated and still wheezing on exam.  Magnesium 2 g IV ordered.  Minimal improvement.  BIPAP ordered.  Epi ordered.  Patient recommended for admission for status asthmaticus.  I spoke with admitting provider Dr. Antionette Char agrees to accept patient.  Patient signed out to oncoming provider.        Final Clinical Impression(s) / ED Diagnoses Final diagnoses:  Severe persistent asthma with exacerbation  Moderate persistent asthma with status asthmaticus    Rx / DC Orders ED Discharge Orders     None         Franne Forts, DO 05/06/23 2335    Franne Forts, DO 05/06/23 2355

## 2023-05-07 ENCOUNTER — Encounter (HOSPITAL_COMMUNITY): Payer: Self-pay | Admitting: Family Medicine

## 2023-05-07 DIAGNOSIS — D72829 Elevated white blood cell count, unspecified: Secondary | ICD-10-CM

## 2023-05-07 DIAGNOSIS — E876 Hypokalemia: Secondary | ICD-10-CM | POA: Diagnosis present

## 2023-05-07 DIAGNOSIS — Z720 Tobacco use: Secondary | ICD-10-CM

## 2023-05-07 DIAGNOSIS — J4541 Moderate persistent asthma with (acute) exacerbation: Secondary | ICD-10-CM

## 2023-05-07 LAB — CBC WITH DIFFERENTIAL/PLATELET
Abs Immature Granulocytes: 0.05 10*3/uL (ref 0.00–0.07)
Abs Immature Granulocytes: 0.07 10*3/uL (ref 0.00–0.07)
Basophils Absolute: 0.1 10*3/uL (ref 0.0–0.1)
Basophils Absolute: 0.1 10*3/uL (ref 0.0–0.1)
Basophils Relative: 0 %
Basophils Relative: 1 %
Eosinophils Absolute: 0 10*3/uL (ref 0.0–0.5)
Eosinophils Absolute: 0.7 10*3/uL — ABNORMAL HIGH (ref 0.0–0.5)
Eosinophils Relative: 0 %
Eosinophils Relative: 6 %
HCT: 41.4 % (ref 39.0–52.0)
HCT: 42.9 % (ref 39.0–52.0)
Hemoglobin: 13.4 g/dL (ref 13.0–17.0)
Hemoglobin: 13.5 g/dL (ref 13.0–17.0)
Immature Granulocytes: 0 %
Immature Granulocytes: 1 %
Lymphocytes Relative: 19 %
Lymphocytes Relative: 4 %
Lymphs Abs: 0.6 10*3/uL — ABNORMAL LOW (ref 0.7–4.0)
Lymphs Abs: 2.3 10*3/uL (ref 0.7–4.0)
MCH: 29.7 pg (ref 26.0–34.0)
MCH: 30.3 pg (ref 26.0–34.0)
MCHC: 31.2 g/dL (ref 30.0–36.0)
MCHC: 32.6 g/dL (ref 30.0–36.0)
MCV: 92.8 fL (ref 80.0–100.0)
MCV: 95.1 fL (ref 80.0–100.0)
Monocytes Absolute: 0.1 10*3/uL (ref 0.1–1.0)
Monocytes Absolute: 0.6 10*3/uL (ref 0.1–1.0)
Monocytes Relative: 1 %
Monocytes Relative: 5 %
Neutro Abs: 11.7 10*3/uL — ABNORMAL HIGH (ref 1.7–7.7)
Neutro Abs: 8.2 10*3/uL — ABNORMAL HIGH (ref 1.7–7.7)
Neutrophils Relative %: 69 %
Neutrophils Relative %: 94 %
Platelets: 294 10*3/uL (ref 150–400)
Platelets: 305 10*3/uL (ref 150–400)
RBC: 4.46 MIL/uL (ref 4.22–5.81)
RBC: 4.51 MIL/uL (ref 4.22–5.81)
RDW: 12.7 % (ref 11.5–15.5)
RDW: 12.8 % (ref 11.5–15.5)
WBC: 11.9 10*3/uL — ABNORMAL HIGH (ref 4.0–10.5)
WBC: 12.4 10*3/uL — ABNORMAL HIGH (ref 4.0–10.5)
nRBC: 0 % (ref 0.0–0.2)
nRBC: 0 % (ref 0.0–0.2)

## 2023-05-07 LAB — BLOOD GAS, VENOUS
Acid-base deficit: 3.4 mmol/L — ABNORMAL HIGH (ref 0.0–2.0)
Bicarbonate: 21.4 mmol/L (ref 20.0–28.0)
O2 Saturation: 95.5 %
Patient temperature: 37
pCO2, Ven: 37 mmHg — ABNORMAL LOW (ref 44–60)
pH, Ven: 7.37 (ref 7.25–7.43)
pO2, Ven: 69 mmHg — ABNORMAL HIGH (ref 32–45)

## 2023-05-07 LAB — I-STAT VENOUS BLOOD GAS, ED
Acid-Base Excess: 1 mmol/L (ref 0.0–2.0)
Acid-Base Excess: 1 mmol/L (ref 0.0–2.0)
Bicarbonate: 27.7 mmol/L (ref 20.0–28.0)
Bicarbonate: 28.1 mmol/L — ABNORMAL HIGH (ref 20.0–28.0)
Calcium, Ion: 1.2 mmol/L (ref 1.15–1.40)
Calcium, Ion: 1.22 mmol/L (ref 1.15–1.40)
HCT: 42 % (ref 39.0–52.0)
HCT: 43 % (ref 39.0–52.0)
Hemoglobin: 14.3 g/dL (ref 13.0–17.0)
Hemoglobin: 14.6 g/dL (ref 13.0–17.0)
O2 Saturation: 95 %
O2 Saturation: 96 %
Patient temperature: 97.6
Patient temperature: 98.7
Potassium: 3.2 mmol/L — ABNORMAL LOW (ref 3.5–5.1)
Potassium: 3.5 mmol/L (ref 3.5–5.1)
Sodium: 140 mmol/L (ref 135–145)
Sodium: 140 mmol/L (ref 135–145)
TCO2: 29 mmol/L (ref 22–32)
TCO2: 30 mmol/L (ref 22–32)
pCO2, Ven: 50.1 mmHg (ref 44–60)
pCO2, Ven: 51.7 mmHg (ref 44–60)
pH, Ven: 7.344 (ref 7.25–7.43)
pH, Ven: 7.348 (ref 7.25–7.43)
pO2, Ven: 77 mmHg — ABNORMAL HIGH (ref 32–45)
pO2, Ven: 91 mmHg — ABNORMAL HIGH (ref 32–45)

## 2023-05-07 LAB — BASIC METABOLIC PANEL
Anion gap: 9 (ref 5–15)
BUN: 7 mg/dL (ref 6–20)
CO2: 25 mmol/L (ref 22–32)
Calcium: 8.3 mg/dL — ABNORMAL LOW (ref 8.9–10.3)
Chloride: 105 mmol/L (ref 98–111)
Creatinine, Ser: 1 mg/dL (ref 0.61–1.24)
GFR, Estimated: >60 mL/min (ref >60)
Glucose, Bld: 122 mg/dL — ABNORMAL HIGH (ref 70–99)
Potassium: 3 mmol/L — ABNORMAL LOW (ref 3.5–5.1)
Sodium: 139 mmol/L (ref 135–145)

## 2023-05-07 LAB — URINALYSIS, COMPLETE (UACMP) WITH MICROSCOPIC
Bacteria, UA: NONE SEEN
Bilirubin Urine: NEGATIVE
Glucose, UA: 50 mg/dL — AB
Hgb urine dipstick: NEGATIVE
Ketones, ur: NEGATIVE mg/dL
Leukocytes,Ua: NEGATIVE
Nitrite: NEGATIVE
Protein, ur: NEGATIVE mg/dL
Specific Gravity, Urine: 1.005 (ref 1.005–1.030)
pH: 6 (ref 5.0–8.0)

## 2023-05-07 LAB — GLUCOSE, CAPILLARY
Glucose-Capillary: 195 mg/dL — ABNORMAL HIGH (ref 70–99)
Glucose-Capillary: 140 mg/dL — ABNORMAL HIGH (ref 70–99)
Glucose-Capillary: 147 mg/dL — ABNORMAL HIGH (ref 70–99)
Glucose-Capillary: 134 mg/dL — ABNORMAL HIGH (ref 70–99)

## 2023-05-07 LAB — COMPREHENSIVE METABOLIC PANEL
ALT: 18 U/L (ref 0–44)
AST: 22 U/L (ref 15–41)
Albumin: 3.6 g/dL (ref 3.5–5.0)
Alkaline Phosphatase: 76 U/L (ref 38–126)
Anion gap: 11 (ref 5–15)
BUN: 8 mg/dL (ref 6–20)
CO2: 20 mmol/L — ABNORMAL LOW (ref 22–32)
Calcium: 8.4 mg/dL — ABNORMAL LOW (ref 8.9–10.3)
Chloride: 104 mmol/L (ref 98–111)
Creatinine, Ser: 0.98 mg/dL (ref 0.61–1.24)
GFR, Estimated: >60 mL/min (ref >60)
Glucose, Bld: 191 mg/dL — ABNORMAL HIGH (ref 70–99)
Potassium: 3.7 mmol/L (ref 3.5–5.1)
Sodium: 135 mmol/L (ref 135–145)
Total Bilirubin: 0.3 mg/dL (ref 0.3–1.2)
Total Protein: 7.1 g/dL (ref 6.5–8.1)

## 2023-05-07 LAB — PHOSPHORUS: Phosphorus: 2.4 mg/dL — ABNORMAL LOW (ref 2.5–4.6)

## 2023-05-07 LAB — MAGNESIUM: Magnesium: 2.1 mg/dL (ref 1.7–2.4)

## 2023-05-07 LAB — PROCALCITONIN: Procalcitonin: <0.1 ng/mL

## 2023-05-07 MED ORDER — EPINEPHRINE 0.3 MG/0.3ML IJ SOAJ
INTRAMUSCULAR | Status: AC
  Filled 2023-05-07: qty 0.3

## 2023-05-07 MED ORDER — INSULIN ASPART 100 UNIT/ML IJ SOLN
0.0000 [IU] | Freq: Three times a day (TID) | INTRAMUSCULAR | Status: DC
  Administered 2023-05-07 (×2): 1 [IU] via SUBCUTANEOUS
  Administered 2023-05-07: 2 [IU] via SUBCUTANEOUS
  Administered 2023-05-08: 1 [IU] via SUBCUTANEOUS

## 2023-05-07 MED ORDER — SODIUM CHLORIDE 0.9 % IV SOLN: INTRAVENOUS | Status: DC | PRN

## 2023-05-07 MED ORDER — ALBUTEROL SULFATE (2.5 MG/3ML) 0.083% IN NEBU: 2.5000 mg | INHALATION_SOLUTION | RESPIRATORY_TRACT | Status: DC | PRN

## 2023-05-07 MED ORDER — ALBUTEROL SULFATE (2.5 MG/3ML) 0.083% IN NEBU
2.5000 mg | INHALATION_SOLUTION | Freq: Once | RESPIRATORY_TRACT | Status: AC
  Administered 2023-05-07: 2.5 mg via RESPIRATORY_TRACT
  Filled 2023-05-07: qty 3

## 2023-05-07 MED ORDER — ONDANSETRON HCL 4 MG/2ML IJ SOLN: 4.0000 mg | Freq: Four times a day (QID) | INTRAMUSCULAR | Status: DC | PRN

## 2023-05-07 MED ORDER — METHYLPREDNISOLONE SODIUM SUCC 125 MG IJ SOLR
125.0000 mg | INTRAMUSCULAR | Status: DC
  Administered 2023-05-07 – 2023-05-08 (×2): 125 mg via INTRAVENOUS
  Filled 2023-05-07 (×2): qty 2

## 2023-05-07 MED ORDER — IPRATROPIUM-ALBUTEROL 0.5-2.5 (3) MG/3ML IN SOLN
3.0000 mL | Freq: Four times a day (QID) | RESPIRATORY_TRACT | Status: DC
  Administered 2023-05-07 – 2023-05-08 (×5): 3 mL via RESPIRATORY_TRACT
  Filled 2023-05-07 (×6): qty 3

## 2023-05-07 MED ORDER — ACETAMINOPHEN 325 MG PO TABS: 650.0000 mg | ORAL_TABLET | Freq: Four times a day (QID) | ORAL | Status: DC | PRN

## 2023-05-07 MED ORDER — MELATONIN 3 MG PO TABS: 3.0000 mg | ORAL_TABLET | Freq: Every evening | ORAL | Status: DC | PRN

## 2023-05-07 MED ORDER — CHLORHEXIDINE GLUCONATE CLOTH 2 % EX PADS
6.0000 | MEDICATED_PAD | Freq: Every day | CUTANEOUS | Status: DC
  Administered 2023-05-07: 6 via TOPICAL

## 2023-05-07 MED ORDER — ACETAMINOPHEN 650 MG RE SUPP: 650.0000 mg | Freq: Four times a day (QID) | RECTAL | Status: DC | PRN

## 2023-05-07 MED ORDER — METHYLPREDNISOLONE SODIUM SUCC 125 MG IJ SOLR: 80.0000 mg | Freq: Two times a day (BID) | INTRAMUSCULAR | Status: DC

## 2023-05-07 MED ORDER — ARFORMOTEROL TARTRATE 15 MCG/2ML IN NEBU
15.0000 ug | INHALATION_SOLUTION | Freq: Two times a day (BID) | RESPIRATORY_TRACT | Status: DC
  Administered 2023-05-07 – 2023-05-09 (×5): 15 ug via RESPIRATORY_TRACT
  Filled 2023-05-07 (×5): qty 2

## 2023-05-07 MED ORDER — BUDESONIDE 0.25 MG/2ML IN SUSP
0.2500 mg | Freq: Two times a day (BID) | RESPIRATORY_TRACT | Status: DC
  Administered 2023-05-07 – 2023-05-09 (×5): 0.25 mg via RESPIRATORY_TRACT
  Filled 2023-05-07 (×5): qty 2

## 2023-05-07 MED ORDER — PANTOPRAZOLE SODIUM 40 MG PO TBEC
40.0000 mg | DELAYED_RELEASE_TABLET | Freq: Every day | ORAL | Status: DC
  Administered 2023-05-07 – 2023-05-09 (×2): 40 mg via ORAL
  Filled 2023-05-07 (×3): qty 1

## 2023-05-07 MED ORDER — NICOTINE 14 MG/24HR TD PT24: 14.0000 mg | MEDICATED_PATCH | Freq: Every day | TRANSDERMAL | Status: DC | PRN

## 2023-05-07 MED ORDER — POTASSIUM CHLORIDE 10 MEQ/100ML IV SOLN
10.0000 meq | INTRAVENOUS | Status: DC
  Administered 2023-05-07: 10 meq via INTRAVENOUS
  Filled 2023-05-07 (×4): qty 100

## 2023-05-07 NOTE — H&P (Signed)
History and Physical      Jimmy Herring ZOX:[id number] DOB: August 15, 1976 DOA: 05/06/2023; DOS: 05/07/2023  PCP: Pcp, No (will further assess) Patient coming from: home   I have personally briefly reviewed patient's old medical records in [facility]  Chief Complaint: Shortness of breath  HPI: Jimmy Herring is a 47 y.o. male with medical history significant for moderate persistent asthma, who is admitted to [facility] on 05/06/2023 by way of transfer from [facility] with acute asthma exacerbation after presenting from home to the latter facility complaining of shortness of breath.  Patient reports 3 days of progressive shortness of breath associate with new onset wheezing.  He conveys that his shortness of breath has not been associate with any orthopnea, PND, or worsening of peripheral edema.  Denies any associated subjective fever, chills, rigors, or generalized myalgias.  Not associate with any chest pain, palpitations, diaphoresis, nausea, vomiting, dizziness, presyncope, or syncope.  No recent preceding trauma.  Not associate with any hemoptysis.  No recent calf tenderness or new lower extremity erythema.  Denies any recent rhinitis or rhinorrhea.  No sore throat.  He confirms a history of underlying asthma, reports good compliance with his outpatient scheduled Symbicort.  He notes recent increase in use of his prn albuterol inhaler over the last few days, but noted no significant improvement in his respiratory status with this measure.  Denies any known baseline supplemental oxygen requirements.  He conveys that he is a current cigarette smoker, while denying any known history of underlying COPD.     [facility] ED Course:  Vital signs in the ED were notable for the following: Afebrile; heart rate in the 70s to low 100s; stop blood pressures in the range of 120s to 140s; respiratory rate 18-28; oxygen saturation 96 to 99% on room air.  He was  subsequently started on BiPAP in the setting of increased work of breathing, with ensuing oxygen saturations noted to be in the range of 98 to 100% on settings of 10/5 with 30% FiO2.  Labs were notable for the following: BMP notable for the following: Sodium 139, potassium 3.0, bicarbonate 25, creatinine 1.0.  CBC notable for white blood cell count 11,900 with 69% neutrophils, hemoglobin 13.5.  Per my interpretation, EKG in ED demonstrated the following: EKG performed [facility], but the result of which has not been released at this time.  Imaging and additional notable ED work-up: 1 view chest x-ray, performed radiology read, shows no evidence of acute cardiopulmonary process, including no evidence of infiltrate, edema, effusion, or pneumothorax.  While in the ED, the following were administered: Duo nebulizer treatment, albuterol nebulizer treatments x 3, Solu-Medrol 125 mg IV x 1, magnesium sulfate 2 g IV over 2 hours.  Subsequently, the patient was admitted to [facility] for further evaluation management of presenting acute asthma exacerbation, with presenting labs notable for hypokalemia as well as leukocytosis.    Review of Systems: As per HPI otherwise 10 point review of systems negative.   Past Medical History:  Diagnosis Date   Asthma     History reviewed. No pertinent surgical history.  Social History:  reports that he has been smoking cigarettes. He has been smoking an average of .5 packs per day. He has never used smokeless tobacco. He reports current alcohol use. He reports that he does not currently use drugs after having used the following drugs: Cocaine.   No Known Allergies  Family  History  Problem Relation Age of Onset   COPD Sister    Asthma Sister     Family history reviewed and not pertinent    Prior to Admission medications   Medication Sig Start Date End Date Taking? Authorizing Provider  albuterol (PROVENTIL) (2.5 MG/3ML) 0.083% nebulizer  solution Take 3 mLs (2.5 mg total) by nebulization every 6 (six) hours as needed for wheezing or shortness of breath. 03/30/22   Wallis Bamberg, PA-C  albuterol (VENTOLIN HFA) 108 (90 Base) MCG/ACT inhaler Inhale 1-2 puffs into the lungs every 6 (six) hours as needed for wheezing or shortness of breath. Patient not taking: Reported on 10/19/2021 08/13/21   Joseph Art, DO  budesonide-formoterol [facility]) 160-4.5 MCG/ACT inhaler Inhale 2 puffs into the lungs in the morning and at bedtime. 03/30/22   Wallis Bamberg, PA-C  ibuprofen (IBU) 600 MG tablet Take 1 tablet (600 mg total) by mouth every 8 (eight) hours as needed for mild pain. Patient not taking: Reported on 10/19/2021 08/13/21   Joseph Art, DO  pantoprazole (PROTONIX) 40 MG tablet Take 1 tablet (40 mg total) by mouth daily. Patient not taking: Reported on 10/19/2021 08/13/21 08/13/22  Joseph Art, DO  predniSONE (DELTASONE) 50 MG tablet Take 1 tablet (50 mg total) by mouth daily with breakfast. 03/30/22   Wallis Bamberg, PA-C     Objective    Physical Exam: Vitals:   05/07/23 0145 05/07/23 0224 05/07/23 0301 05/07/23 0310  BP: 132/84 132/84 126/68   Pulse: 77 77 84   Resp:  18 19   Temp:  97.9 F (36.6 C)    TempSrc:  Oral    SpO2: 100% 100% 98% 98%  Weight:      Height:        General: appears to be stated age; alert, oriented; on BiPAP Skin: warm, dry, no rash Head:  AT/[place] Mouth:  Oral mucosa membranes appear moist, normal dentition Neck: supple; trachea midline Heart:  RRR; did not appreciate any M/R/G Lungs: Bilateral expiratory wheezes noted, but otherwise CTAB, did not appreciate any rales, or rhonchi Abdomen: + BS; soft, ND, NT Vascular: 2+ pedal pulses b/l; 2+ radial pulses b/l Extremities: no peripheral edema, no muscle wasting Neuro: strength and sensation intact in upper and lower extremities b/l    Labs on Admission: I have personally reviewed following labs and imaging studies  CBC: Recent Labs  Lab  05/07/23 0002 05/07/23 0021 05/07/23 0206  WBC 11.9*  --   --   NEUTROABS 8.2*  --   --   HGB 13.5 14.3 14.6  HCT 41.4 42.0 43.0  MCV 92.8  --   --   PLT 305  --   --    Basic Metabolic Panel: Recent Labs  Lab 05/07/23 0002 05/07/23 0021 05/07/23 0206  NA 139 140 140  K 3.0* 3.2* 3.5  CL 105  --   --   CO2 25  --   --   GLUCOSE 122*  --   --   BUN 7  --   --   CREATININE 1.00  --   --   CALCIUM 8.3*  --   --    GFR: Estimated Creatinine Clearance: 92.3 mL/min (by C-G formula based on SCr of 1 mg/dL). Liver Function Tests: No results for input(s): "AST", "ALT", "ALKPHOS", "BILITOT", "PROT", "ALBUMIN" in the last 168 hours. No results for input(s): "LIPASE", "AMYLASE" in the last 168 hours. No results for input(s): "AMMONIA" in the last 168  hours. Coagulation Profile: No results for input(s): "INR", "PROTIME" in the last 168 hours. Cardiac Enzymes: No results for input(s): "CKTOTAL", "CKMB", "CKMBINDEX", "TROPONINI" in the last 168 hours. BNP (last 3 results) No results for input(s): "PROBNP" in the last 8760 hours. HbA1C: No results for input(s): "HGBA1C" in the last 72 hours. CBG: No results for input(s): "GLUCAP" in the last 168 hours. Lipid Profile: No results for input(s): "CHOL", "HDL", "LDLCALC", "TRIG", "CHOLHDL", "LDLDIRECT" in the last 72 hours. Thyroid Function Tests: No results for input(s): "TSH", "T4TOTAL", "FREET4", "T3FREE", "THYROIDAB" in the last 72 hours. Anemia Panel: No results for input(s): "VITAMINB12", "FOLATE", "FERRITIN", "TIBC", "IRON", "RETICCTPCT" in the last 72 hours. Urine analysis: No results found for: "COLORURINE", "APPEARANCEUR", "LABSPEC", "PHURINE", "GLUCOSEU", "HGBUR", "BILIRUBINUR", "KETONESUR", "PROTEINUR", "UROBILINOGEN", "NITRITE", "LEUKOCYTESUR"  Radiological Exams on Admission: DG Chest Portable 1 View  Result Date: 05/06/2023 CLINICAL DATA:  Shortness of breath.  History of asthma. EXAM: PORTABLE CHEST 1 VIEW COMPARISON:   PA and lateral 10/18/2021 FINDINGS: The heart size and mediastinal contours are within normal limits. Both lungs are clear. The visualized skeletal structures are unremarkable. IMPRESSION: No active disease.  Stable chest. Electronically Signed   By: Almira Bar M.D.   On: 05/06/2023 22:56      Assessment/Plan    Principal Problem:   Asthma exacerbation Active Problems:   Hypokalemia   Leukocytosis   Tobacco abuse     #) Acute asthma exacerbation: In the context of documented history of moderate persistent asthma, the patient presents with 3 days of progressive shortness of breath associated with new onset expiratory wheezing, increased work of breathing, with chest x-ray showing no evidence of acute cardiopulmonary process.  Etiology leading to acute exacerbation entirely clear at this time.  No clinical or radiographic evidence to suggest acutely decompensated heart failure.  Good compliance with home respiratory regimen consisting of scheduled Symbicort as well as as needed albuterol.  presenting VBG demonstrates no evidence to suggest respiratory fatigue at this time.  While no evidence of hypoxia, the patient was started on BiPAP due to evidence of increased work of breathing.  Will continue BiPAP for now, and pursue updated VBG in the morning to evaluate for any interval evidence to suggest developing respiratory fatigue.   Plan: Scheduled duo nebulizer treatments, prn albuterol, Solu-Medrol.  Check serum magnesium and phosphorus levels.  Repeat VBG in the morning to evaluate for any interval evidence to suggest respiratory fatigue.  Monitor continuous pulse oximetry.  Add on procalcitonin level.  Repeat CMP and CBC in the morning.                 #) Hypokalemia: presenting potassium level noted to be 3.0, with likely contribution from recent increase use of beta-2 agonists resulting in intracellular shift of serum potassium.    Plan: monitor on tele. KCl 40 meq IV over  4 hours. Add-on serum mag level. CMP, mag level in the AM.                  #) Leukocytosis: Presenting CBC reflects mildly elevated white cell count of 11,900. Suspect that this is reactive in nature in the context of presenting acute asthma exacerbation.   No evidence to suggest underlying infectious process at this time, including chest x-ray which shows no evidence of acute cardiopulmonary process, including no evidence of infiltrate.  In the absence of will blood cell count greater than 12,000 and in the absence of objective fever, SIRS criteria are not met.  Additionally, in the absence of any overt evidence of underlying factious process, criteria for sepsis not currently met.   Appears hemodynamically stable.  Therefore, will refrain from initiation of antibiotics at this time.  Plan: Repeat CBC with diff in the morning.  Monitor strict I's and O's, daily weights.  Check urinalysis.  Add on procalcitonin level.                #) Chronic tobacco abuse: Patient conveys that they are a current smoker, having smoked half pack ppd for at least the last 5 years years.   Plan: Counseled the patient for less than 2 minutes on the importance of complete smoking discontinuation.  Order placed for prn nicotine patch for use during this hospitalization.       DVT prophylaxis: SCD's   Code Status: Full code Family Communication: none Disposition Plan: Per Rounding Team Consults called: none;  Admission status: inpatient    I SPENT GREATER THAN 75  MINUTES IN CLINICAL CARE TIME/MEDICAL DECISION-MAKING IN COMPLETING THIS ADMISSION.     Chaney Born Turquoise Esch DO [facility] From 7PM - 7AM   05/07/2023, 3:54 AM

## 2023-05-07 NOTE — ED Notes (Signed)
[facility] called @00 :03

## 2023-05-07 NOTE — Progress Notes (Signed)
PROGRESS NOTE   Jimmy Herring  ZOX:[id number]    DOB: 1976-06-25    DOA: 05/06/2023  PCP: Pcp, No   I have briefly reviewed patients previous medical records in [facility].  Chief Complaint  Patient presents with   Shortness of Breath    Brief Narrative:  47 year old male, homeless and lives in a tent with his girlfriend who is at bedside, medical history significant for longstanding moderate persistent asthma, tobacco use disorder, medication noncompliance due to financial constraints, presented to ED due to 3 to 4 days history of progressive dyspnea, wheezing. Admitted to ICU with acute eczema exacerbation.  Taken off BiPAP on 6/28 at approximately 5 AM.   Assessment & Plan:  Principal Problem:   Asthma exacerbation Active Problems:   Hypokalemia   Leukocytosis   Tobacco abuse   Acute respiratory failure due to acute asthma exacerbation Complicating moderate persistent asthma, ongoing tobacco use and medication noncompliance due to financial constraints. In ED, patient was tachypneic, tachycardic and in respiratory distress.  Meets criteria for acute respiratory failure based on BiPAP needs from approximately 10:30 PM to 5 AM on night of admission.  Treated with DuoNebs, multiple albuterol nebulizers, IV Solu-Medrol 125 mg x 1, IV magnesium sulfate 2 g x 1 and placed on BiPAP due to increased work of breathing. Improved and taken off BiPAP on 6/28 at approximately 5 AM.  Currently saturating in the 90s on room air.  Change BiPAP to as needed. Chest x-ray without active disease. Continue IV Solu-Medrol 125 mg every 24 hours, scheduled DuoNebs, as needed albuterol nebulizers, added a for Medrol and budesonide nebulizations. Incentive spirometry and flutter valve. Monitor closely and if continues to do okay then probable transfer to medical bed later today.  Tobacco use disorder: Cessation counseled.  Remains on nicotine patch.  Hypokalemia: Replaced.  Magnesium normal.   Follow BMP in AM.  Mild leukocytosis: Reactive.  No clinical concern for infectious etiology.  Hyperglycemia: Check A1c.  Monitor CBGs and consider SSI if persistently greater than 180.  Homelessness: TOC consulted for assistance with possible shelter if they wish, medication assistance at discharge and PCP to follow-up in 1 to 2 weeks from hospital discharge.  Pending clinical improvement, may discharge on the weekend.  Body mass index is 22.3 kg/m.   ACP Documents: None DVT prophylaxis: SCDs Start: 05/07/23 0305     Code Status: Full Code:  Family Communication: Girlfriend at bedside. Disposition:  Status is: Inpatient Remains inpatient appropriate because: IV meds and close monitoring.     Consultants:     Procedures:     Antimicrobials:      Subjective:  History as noted above.  Patient defers history to his girlfriend at bedside.  She indicates that patient does well when he is able to get his Symbicort inhaler.  In the past this was provided by a friend.  However in the last month or so, has not had access to Symbicort.  As needed albuterol inhaler by itself does not work well for him she says.  4 to 5 days back was incarcerated for a day, appears to gotten a dose of prednisone while there and they think that the dose was high and caused him to be jittery but improved his breathing and opened up his sinuses.  She indicates that the hot weather likely precipitated his asthma. Patient reports breathing has slightly improved but ongoing chest tightness, wheezing and dyspnea.  No cough.  No chest pain.  Reports smoking  only 1 to 2 cigarettes/day.  Denies alcohol or other substance abuse.  Objective:   Vitals:   05/07/23 0500 05/07/23 0600 05/07/23 0608 05/07/23 0700  BP: 116/76  139/83 (!) 163/77  Pulse: 90 (!) 104 93 (!) 101  Resp: 17 (!) 22 16 20   Temp:  97.9 F (36.6 C)    TempSrc:  Oral    SpO2: 94% 93% 93% 97%  Weight: 68.5 kg     Height:        General  exam: Young male, moderately built and nourished, appears to be in mild respiratory distress Respiratory system: Abdomen breath sounds bilaterally with scattered few medium pitched expiratory rhonchi, more so posteriorly.  No crackles.  Minimal intermittent tachypnea and nasal flare.  No other accessory muscles active. Cardiovascular system: S1 & S2 heard, RRR. No JVD, murmurs, rubs, gallops or clicks. No pedal edema.  Telemetry personally reviewed: Sinus rhythm. Gastrointestinal system: Abdomen is nondistended, soft and nontender. No organomegaly or masses felt. Normal bowel sounds heard. Central nervous system: Alert and oriented. No focal neurological deficits. Extremities: Symmetric 5 x 5 power. Skin: No rashes, lesions or ulcers Psychiatry: Judgement and insight appear normal. Mood & affect appropriate.     Data Reviewed:   I have personally reviewed following labs and imaging studies   CBC: Recent Labs  Lab 05/07/23 0002 05/07/23 0021 05/07/23 0206 05/07/23 0608  WBC 11.9*  --   --  12.4*  NEUTROABS 8.2*  --   --  11.7*  HGB 13.5 14.3 14.6 13.4  HCT 41.4 42.0 43.0 42.9  MCV 92.8  --   --  95.1  PLT 305  --   --  294    Basic Metabolic Panel: Recent Labs  Lab 05/07/23 0002 05/07/23 0021 05/07/23 0206 05/07/23 0608  NA 139 140 140 135  K 3.0* 3.2* 3.5 3.7  CL 105  --   --  104  CO2 25  --   --  20*  GLUCOSE 122*  --   --  191*  BUN 7  --   --  8  CREATININE 1.00  --   --  0.98  CALCIUM 8.3*  --   --  8.4*  MG  --   --   --  2.1  PHOS  --   --   --  2.4*    Liver Function Tests: Recent Labs  Lab 05/07/23 0608  AST 22  ALT 18  ALKPHOS 76  BILITOT 0.3  PROT 7.1  ALBUMIN 3.6    CBG: No results for input(s): "GLUCAP" in the last 168 hours.  Microbiology Studies:  No results found for this or any previous visit (from the past 240 hour(s)).  Radiology Studies:  DG Chest Portable 1 View  Result Date: 05/06/2023 CLINICAL DATA:  Shortness of breath.   History of asthma. EXAM: PORTABLE CHEST 1 VIEW COMPARISON:  PA and lateral 10/18/2021 FINDINGS: The heart size and mediastinal contours are within normal limits. Both lungs are clear. The visualized skeletal structures are unremarkable. IMPRESSION: No active disease.  Stable chest. Electronically Signed   By: Almira Bar M.D.   On: 05/06/2023 22:56    Scheduled Meds:    arformoterol  15 mcg Nebulization BID   budesonide (PULMICORT) nebulizer solution  0.25 mg Nebulization BID   EPINEPHrine       ipratropium-albuterol  3 mL Nebulization Q6H   methylPREDNISolone (SOLU-MEDROL) injection  125 mg Intravenous Q24H   pantoprazole  40 mg Oral  Daily    Continuous Infusions:    sodium chloride 20 mL/hr at 05/07/23 0653     LOS: 0 days     Marcellus Scott, MD,  FACP, [facility], [facility], [facility], [facility]   [facility] [place]     To contact the attending provider between 7A-7P or the covering provider during after hours 7P-7A, please log into the web site [email or web address] and access using universal [place] password for that web site. If you do not have the password, please call the hospital operator.  05/07/2023, 7:30 AM

## 2023-05-07 NOTE — Progress Notes (Signed)
Called to pt bedside by RN.  Bipap mask removed by RN per pt request.  Pt found on room air, HR90, rr17, spo2 94%, no increased wob at this time.  Bipap remains in room on standby.

## 2023-05-07 NOTE — Progress Notes (Signed)
   05/07/23 1943  BiPAP/CPAP/SIPAP  Reason BIPAP/CPAP not in use Other(comment) (Pt is doing well at this time no need of bipap. Machine remained bedside . Pt said he doesnt wear anything at home. Pt is currently on RA)

## 2023-05-08 LAB — BASIC METABOLIC PANEL
Anion gap: 8 (ref 5–15)
BUN: 11 mg/dL (ref 6–20)
CO2: 25 mmol/L (ref 22–32)
Calcium: 8.6 mg/dL — ABNORMAL LOW (ref 8.9–10.3)
Chloride: 102 mmol/L (ref 98–111)
Creatinine, Ser: 0.84 mg/dL (ref 0.61–1.24)
GFR, Estimated: >60 mL/min (ref >60)
Glucose, Bld: 127 mg/dL — ABNORMAL HIGH (ref 70–99)
Potassium: 4.3 mmol/L (ref 3.5–5.1)
Sodium: 135 mmol/L (ref 135–145)

## 2023-05-08 LAB — GLUCOSE, CAPILLARY
Glucose-Capillary: 100 mg/dL — ABNORMAL HIGH (ref 70–99)
Glucose-Capillary: 137 mg/dL — ABNORMAL HIGH (ref 70–99)
Glucose-Capillary: 112 mg/dL — ABNORMAL HIGH (ref 70–99)
Glucose-Capillary: 122 mg/dL — ABNORMAL HIGH (ref 70–99)

## 2023-05-08 LAB — HEMOGLOBIN A1C
Hgb A1c MFr Bld: 6.1 % — ABNORMAL HIGH (ref 4.8–5.6)
Mean Plasma Glucose: 128 mg/dL

## 2023-05-08 MED ORDER — IPRATROPIUM-ALBUTEROL 0.5-2.5 (3) MG/3ML IN SOLN
3.0000 mL | Freq: Two times a day (BID) | RESPIRATORY_TRACT | Status: DC
  Administered 2023-05-08 – 2023-05-09 (×2): 3 mL via RESPIRATORY_TRACT
  Filled 2023-05-08 (×2): qty 3

## 2023-05-08 MED ORDER — PNEUMOCOCCAL 20-VAL CONJ VACC 0.5 ML IM SUSY: 0.5000 mL | PREFILLED_SYRINGE | INTRAMUSCULAR | Status: DC | PRN

## 2023-05-08 NOTE — Progress Notes (Signed)
PROGRESS NOTE   Jimmy Herring  NFA:[id number]    DOB: February 06, 1976    DOA: 05/06/2023  PCP: Pcp, No   I have briefly reviewed patients previous medical records in [facility].  Chief Complaint  Patient presents with   Shortness of Breath    Brief Narrative:  47 year old male, homeless and lives in a tent with his girlfriend who is at bedside, medical history significant for longstanding moderate persistent asthma, tobacco use disorder, medication noncompliance due to financial constraints, presented to ED due to 3 to 4 days history of progressive dyspnea, wheezing. Admitted to ICU with acute eczema exacerbation.  Taken off BiPAP on 6/28 at approximately 5 AM.  Clinically improving.  Transferred from SDU to medical bed 6/29.  Pending improvement, possible discharge in the next 24 to 48 hours.   Assessment & Plan:  Principal Problem:   Asthma exacerbation Active Problems:   Hypokalemia   Leukocytosis   Tobacco abuse   Acute respiratory failure due to acute asthma exacerbation Complicating moderate persistent asthma, ongoing tobacco use and medication noncompliance due to financial constraints. In ED, patient was tachypneic, tachycardic and in respiratory distress.  Meets criteria for acute respiratory failure based on BiPAP needs from approximately 10:30 PM to 5 AM on night of admission.  Treated with DuoNebs, multiple albuterol nebulizers, IV Solu-Medrol 125 mg x 1, IV magnesium sulfate 2 g x 1 and placed on BiPAP due to increased work of breathing. Improved and taken off BiPAP on 6/28 at approximately 5 AM.  Currently saturating in the 90s on room air.  Change BiPAP to as needed. Chest x-ray without active disease. Continue IV Solu-Medrol 125 mg every 24 hours, scheduled DuoNebs, as needed albuterol nebulizers, added a for Medrol and budesonide nebulizations. Incentive spirometry and flutter valve. Remained on room air through the course of the last 24 hours, had some wheezing  yesterday and RN did not feel that he was ready yet to transfer out of SDU.  Has clinically improved now.  Will transfer to medical bed.  Clinical social work is yet to see the patient for PCP, home med needs and any other assistance.  Discussed with ICU RN at bedside.  Tobacco use disorder: Cessation counseled.  Remains on nicotine patch.  Hypokalemia: Replaced.  Magnesium normal.    Mild leukocytosis: Reactive.  No clinical concern for infectious etiology.  Procalcitonin negative.  Hyperglycemia/prediabetes: A1 C6.1.  Continue SSI.  Will need outpatient follow-up.  Homelessness: TOC consulted for assistance with possible shelter if they wish, medication assistance at discharge and PCP to follow-up in 1 to 2 weeks from hospital discharge.  Pending clinical improvement, may discharge on the weekend.  Body mass index is 22.3 kg/m.   ACP Documents: None DVT prophylaxis: SCDs Start: 05/07/23 0305     Code Status: Full Code:  Family Communication: Girlfriend at bedside. Disposition:  Inpatient appropriate.  Possible DC in the next 24 to 48 hours.     Consultants:     Procedures:     Antimicrobials:      Subjective:  Reports feeling much better.  Breathing improved by 70% but not yet at baseline.  Still some intermittent wheezing.  Per nursing, no acute issues overnight and remained on room air.  Objective:   Vitals:   05/08/23 0500 05/08/23 0810 05/08/23 0811 05/08/23 0814  BP:      Pulse: 82     Resp: 14     Temp:      TempSrc:  SpO2: 96% 99% 100% 100%  Weight:      Height:        General exam: Young male, moderately built and nourished, lying comfortably propped up in bed, undergoing nebulizer treatment, in no distress.  Looks improved compared to yesterday. Respiratory system: Improved breath sounds compared to yesterday.  Still with occasional rhonchi bilaterally but much less compared to yesterday.  No crackles.  No increased work of  breathing. Cardiovascular system: S1 & S2 heard, RRR. No JVD, murmurs, rubs, gallops or clicks. No pedal edema.  Telemetry personally reviewed, sinus rhythm. Gastrointestinal system: Abdomen is nondistended, soft and nontender. No organomegaly or masses felt. Normal bowel sounds heard. Central nervous system: Alert and oriented. No focal neurological deficits. Extremities: Symmetric 5 x 5 power. Skin: No rashes, lesions or ulcers Psychiatry: Judgement and insight appear normal. Mood & affect appropriate.     Data Reviewed:   I have personally reviewed following labs and imaging studies   CBC: Recent Labs  Lab 05/07/23 0002 05/07/23 0021 05/07/23 0206 05/07/23 0608  WBC 11.9*  --   --  12.4*  NEUTROABS 8.2*  --   --  11.7*  HGB 13.5 14.3 14.6 13.4  HCT 41.4 42.0 43.0 42.9  MCV 92.8  --   --  95.1  PLT 305  --   --  294    Basic Metabolic Panel: Recent Labs  Lab 05/07/23 0002 05/07/23 0021 05/07/23 0206 05/07/23 0608 05/08/23 0259  NA 139 140 140 135 135  K 3.0* 3.2* 3.5 3.7 4.3  CL 105  --   --  104 102  CO2 25  --   --  20* 25  GLUCOSE 122*  --   --  191* 127*  BUN 7  --   --  8 11  CREATININE 1.00  --   --  0.98 0.84  CALCIUM 8.3*  --   --  8.4* 8.6*  MG  --   --   --  2.1  --   PHOS  --   --   --  2.4*  --     Liver Function Tests: Recent Labs  Lab 05/07/23 0608  AST 22  ALT 18  ALKPHOS 76  BILITOT 0.3  PROT 7.1  ALBUMIN 3.6    CBG: Recent Labs  Lab 05/07/23 1558 05/07/23 2205 05/08/23 0743  GLUCAP 147* 134* 100*    Microbiology Studies:  No results found for this or any previous visit (from the past 240 hour(s)).  Radiology Studies:  DG Chest Portable 1 View  Result Date: 05/06/2023 CLINICAL DATA:  Shortness of breath.  History of asthma. EXAM: PORTABLE CHEST 1 VIEW COMPARISON:  PA and lateral 10/18/2021 FINDINGS: The heart size and mediastinal contours are within normal limits. Both lungs are clear. The visualized skeletal structures are  unremarkable. IMPRESSION: No active disease.  Stable chest. Electronically Signed   By: Almira Bar M.D.   On: 05/06/2023 22:56    Scheduled Meds:    arformoterol  15 mcg Nebulization BID   budesonide (PULMICORT) nebulizer solution  0.25 mg Nebulization BID   Chlorhexidine Gluconate Cloth  6 each Topical Daily   insulin aspart  0-9 Units Subcutaneous TID WC   ipratropium-albuterol  3 mL Nebulization Q6H   methylPREDNISolone (SOLU-MEDROL) injection  125 mg Intravenous Q24H   pantoprazole  40 mg Oral Daily    Continuous Infusions:    sodium chloride 20 mL/hr at 05/07/23 1830     LOS: 1  day     Marcellus Scott, MD,  FACP, [facility], [facility], [facility]   [facility] [place]     To contact the attending provider between 7A-7P or the covering provider during after hours 7P-7A, please log into the web site [email or web address] and access using universal [place] password for that web site. If you do not have the password, please call the hospital operator.  05/08/2023, 8:24 AM

## 2023-05-08 NOTE — Plan of Care (Signed)
Received patient from the ICU. Patient awake, alert orient x4. Vital signs stable. Denies pain and shortness of breath. Significant other at bedside. Safety precautions maintained. Problem: Education: Goal: Knowledge of General Education information will improve Description: Including pain rating scale, medication(s)/side effects and non-pharmacologic comfort measures Outcome: Progressing   Problem: Health Behavior/Discharge Planning: Goal: Ability to manage health-related needs will improve Outcome: Progressing   Problem: Clinical Measurements: Goal: Ability to maintain clinical measurements within normal limits will improve Outcome: Progressing Goal: Will remain free from infection Outcome: Progressing Goal: Diagnostic test results will improve Outcome: Progressing Goal: Respiratory complications will improve Outcome: Progressing Goal: Cardiovascular complication will be avoided Outcome: Progressing   Problem: Activity: Goal: Risk for activity intolerance will decrease Outcome: Progressing   Problem: Nutrition: Goal: Adequate nutrition will be maintained Outcome: Progressing   Problem: Coping: Goal: Level of anxiety will decrease Outcome: Progressing   Problem: Elimination: Goal: Will not experience complications related to bowel motility Outcome: Progressing Goal: Will not experience complications related to urinary retention Outcome: Progressing   Problem: Pain Managment: Goal: General experience of comfort will improve Outcome: Progressing   Problem: Safety: Goal: Ability to remain free from injury will improve Outcome: Progressing   Problem: Skin Integrity: Goal: Risk for impaired skin integrity will decrease Outcome: Progressing   Problem: Education: Goal: Ability to describe self-care measures that may prevent or decrease complications (Diabetes Survival Skills Education) will improve Outcome: Progressing Goal: Individualized Educational  Video(s) Outcome: Progressing   Problem: Coping: Goal: Ability to adjust to condition or change in health will improve Outcome: Progressing   Problem: Fluid Volume: Goal: Ability to maintain a balanced intake and output will improve Outcome: Progressing   Problem: Health Behavior/Discharge Planning: Goal: Ability to identify and utilize available resources and services will improve Outcome: Progressing Goal: Ability to manage health-related needs will improve Outcome: Progressing   Problem: Metabolic: Goal: Ability to maintain appropriate glucose levels will improve Outcome: Progressing   Problem: Nutritional: Goal: Maintenance of adequate nutrition will improve Outcome: Progressing Goal: Progress toward achieving an optimal weight will improve Outcome: Progressing   Problem: Skin Integrity: Goal: Risk for impaired skin integrity will decrease Outcome: Progressing   Problem: Tissue Perfusion: Goal: Adequacy of tissue perfusion will improve Outcome: Progressing

## 2023-05-09 LAB — GLUCOSE, CAPILLARY
Glucose-Capillary: 98 mg/dL (ref 70–99)
Glucose-Capillary: 109 mg/dL — ABNORMAL HIGH (ref 70–99)

## 2023-05-09 MED ORDER — NICOTINE 14 MG/24HR TD PT24: 14.0000 mg | MEDICATED_PATCH | Freq: Every day | TRANSDERMAL | 0 refills | Status: DC | PRN

## 2023-05-09 MED ORDER — PANTOPRAZOLE SODIUM 40 MG PO TBEC: 40.0000 mg | DELAYED_RELEASE_TABLET | Freq: Every day | ORAL | 0 refills | Status: DC

## 2023-05-09 MED ORDER — BUDESONIDE-FORMOTEROL FUMARATE 160-4.5 MCG/ACT IN AERO: 2.0000 | INHALATION_SPRAY | Freq: Two times a day (BID) | RESPIRATORY_TRACT | 2 refills | Status: DC

## 2023-05-09 MED ORDER — PREDNISONE 10 MG PO TABS: ORAL_TABLET | ORAL | 0 refills | Status: DC

## 2023-05-09 MED ORDER — PREDNISONE 20 MG PO TABS
40.0000 mg | ORAL_TABLET | Freq: Every day | ORAL | Status: DC
  Administered 2023-05-09: 40 mg via ORAL
  Filled 2023-05-09: qty 2

## 2023-05-09 MED ORDER — ALBUTEROL SULFATE HFA 108 (90 BASE) MCG/ACT IN AERS: 1.0000 | INHALATION_SPRAY | Freq: Four times a day (QID) | RESPIRATORY_TRACT | 2 refills | Status: DC | PRN

## 2023-05-09 NOTE — Plan of Care (Signed)
No respiratory distress at room air. VS stable and within baseline. Denies pain.  Continue care and safety precautions.   Problem: Health Behavior/Discharge Planning: Goal: Ability to manage health-related needs will improve Outcome: Progressing   Problem: Coping: Goal: Level of anxiety will decrease Outcome: Progressing   Problem: Safety: Goal: Ability to remain free from injury will improve Outcome: Progressing   Problem: Education: Goal: Ability to describe self-care measures that may prevent or decrease complications (Diabetes Survival Skills Education) will improve Outcome: Progressing   Problem: Coping: Goal: Ability to adjust to condition or change in health will improve Outcome: Progressing   Problem: Health Behavior/Discharge Planning: Goal: Ability to identify and utilize available resources and services will improve Outcome: Progressing Goal: Ability to manage health-related needs will improve Outcome: Progressing

## 2023-05-09 NOTE — Discharge Instructions (Signed)

## 2023-05-09 NOTE — Discharge Summary (Signed)
Physician Discharge Summary  Hood Hatton Fahl UEA:[id number] DOB: [birth year]  PCP: Pcp, No  Admitted from: Home Discharged to: Home  Admit date: 05/06/2023 Discharge date: 05/09/2023  Recommendations for Outpatient Follow-up:    Follow-up Information     [facility] Follow up in 1 week(s).   Why: Kindly call any of the MD contacts, provided by the hospital case manager to make an appointment to see a new family physician in 1 to 2 weeks of hospital discharge.  To be seen with repeat labs (CBC & BMP). Contact information: [address] [place] [address] [phone]        [facility] Follow up.   Contact information: [address] [place] [address] [phone]        [facility] Follow up.   Contact information: [address] [place] [address] [phone]        [facility] Follow up.   Contact information: [place] [address] [phone]        [facility] Follow up.   Contact information: [address] [place] [address] [phone]        [facility] Follow up.   Contact information: [address] [place] [address]                 Home Health: None    Equipment/Devices: None    Discharge Condition: Improved and stable.   Code Status: Full Code Diet recommendation:  Discharge Diet Orders (From admission, onward)     Start     Ordered   05/09/23 0000  Diet - low sodium heart healthy        05/09/23 1223             Discharge Diagnoses:  Principal Problem:   Asthma exacerbation Active Problems:   Hypokalemia   Leukocytosis   Tobacco abuse   Brief Summary: 47 year old male, homeless  and lives in a tent with his girlfriend who is at bedside, medical history significant for longstanding moderate persistent asthma, tobacco use disorder, medication noncompliance due to financial constraints, presented to ED due to 3 to 4 days history of progressive dyspnea, wheezing. Admitted to ICU with acute eczema exacerbation.  Taken off BiPAP on 6/28 at approximately 5 AM.  Clinically improving.  Transferred from SDU to medical bed 6/29.  Clinically improved.  TOC evaluated him today and provided resources including a match letter for discharge meds, PCP needs and homelessness.     Assessment & Plan:   Acute respiratory failure due to acute asthma exacerbation - Complicating moderate persistent asthma, ongoing tobacco use and medication noncompliance due to financial constraints. - In ED, patient was tachypneic, tachycardic and in respiratory distress.  Met criteria for acute respiratory failure based on BiPAP needs from approximately 10:30 PM to 5 AM on night of admission.  Treated with DuoNebs, multiple albuterol nebulizers, IV Solu-Medrol 125 mg x 1, IV magnesium sulfate 2 g x 1 and placed on BiPAP due to increased work of breathing. -  Improved and taken off BiPAP on 6/28 at approximately 5 AM.  Has not needed BiPAP since then. - Chest x-ray without active disease. - Treated with IV Solu-Medrol 125 mg every 24 hours, scheduled DuoNebs, as needed albuterol nebulizers, added Aformeterol and budesonide  nebulizations. - Incentive spirometry and flutter valve. -Clinically improved.  No dyspnea or hypoxia.  Asthma exacerbation resolved.  DC meds as below.  Tobacco use disorder: - Cessation counseled.  Remains on nicotine patch.   Hypokalemia: - Replaced.  Magnesium normal.     Mild leukocytosis: - Reactive.  No clinical concern for infectious etiology.  Procalcitonin negative.   Hyperglycemia/prediabetes: - A1 C6.1.  Will need outpatient follow-up.   Homelessness: TOC consulted for  assistance with possible shelter if they wish (per RN, patient declined shelter), medication assistance at discharge and PCP to follow-up in 1 to 2 weeks from hospital discharge.     Body mass index is 22.3 kg/m.   Consultations: None  Procedures: None   Discharge Instructions  Discharge Instructions     Call MD for:  difficulty breathing, headache or visual disturbances   Complete by: As directed    Call MD for:  extreme fatigue   Complete by: As directed    Call MD for:  persistant dizziness or light-headedness   Complete by: As directed    Call MD for:  temperature >100.4   Complete by: As directed    Diet - low sodium heart healthy   Complete by: As directed    Increase activity slowly   Complete by: As directed         Medication List     TAKE these medications    albuterol 108 (90 Base) MCG/ACT inhaler Commonly known as: VENTOLIN HFA Inhale 1-2 puffs into the lungs every 6 (six) hours as needed for wheezing or shortness of breath. What changed: Another medication with the same name was removed. Continue taking this medication, and follow the directions you see here.   budesonide-formoterol 160-4.5 MCG/ACT inhaler Commonly known as: Symbicort Inhale 2 puffs into the lungs in the morning and at bedtime.   ibuprofen 600 MG tablet Commonly known as: IBU Take 1 tablet (600 mg total) by mouth every 8 (eight) hours as needed for mild pain.   nicotine 14 mg/24hr patch Commonly known as: NICODERM CQ - dosed in mg/24 hours Place 1 patch (14 mg total) onto the skin daily as needed (smoking cessation).   pantoprazole 40 MG tablet Commonly known as: Protonix Take 1 tablet (40 mg total) by mouth daily.   predniSONE 10 MG tablet Commonly known as: DELTASONE Take 4 tabs (40 mg total) daily for 3 days, then 3 tabs (30 mg total) daily for 3 days, then 2 tabs (20 mg total) daily for 3 days, then 1 tab (10 mg total) daily for 3 days, then stop. Start taking on: May 10, 2023 What changed:  medication strength how much to take how to take this when to take this additional instructions       No Known Allergies    Procedures/Studies: DG Chest Portable 1 View  Result Date: 05/06/2023 CLINICAL DATA:  Shortness of breath.  History of asthma. EXAM: PORTABLE CHEST 1 VIEW COMPARISON:  PA and lateral 10/18/2021 FINDINGS: The heart size and mediastinal contours are within normal limits. Both lungs are clear. The visualized skeletal structures are unremarkable. IMPRESSION: No active disease.  Stable chest. Electronically Signed   By: Almira Bar M.D.   On: 05/06/2023 22:56      Subjective: Seen this morning along with girlfriend at bedside.  Denies complaints.  Anxious to discharge.  No dyspnea, chest pain or cough reported.  Discharge Exam:  Vitals:   05/08/23 2024 05/09/23 0612 05/09/23 0835 05/09/23 1200  BP: (!) 130/92 134/87  134/86  Pulse: 98 (!) 101  98  Resp: 18 18  20   Temp: 97.7 F (36.5 C) 98.1 F (36.7 C)  98.1 F (36.7 C)  TempSrc: Oral Oral  Oral  SpO2: 100% 100% 99% 99%  Weight:      Height:        General exam: Young male, moderately built and nourished, sitting up comfortably in reclining chair all dressed up and ready to leave. Respiratory system: Clear to auscultation.  No increased work of breathing. Cardiovascular system: S1 & S2 heard, RRR. No JVD, murmurs, rubs, gallops or clicks. No pedal edema.  Gastrointestinal system: Abdomen is nondistended, soft and nontender. No organomegaly or masses felt. Normal bowel sounds heard. Central nervous system: Alert and oriented. No focal neurological deficits. Extremities: Symmetric 5 x 5 power. Skin: No rashes, lesions or ulcers Psychiatry: Judgement and insight appear normal. Mood & affect appropriate.     The results of significant diagnostics from this hospitalization (including imaging, microbiology, ancillary and laboratory) are listed below for reference.      Microbiology: No results found for this or any previous visit (from the past 240 hour(s)).   Labs: CBC: Recent Labs  Lab 05/07/23 0002 05/07/23 0021 05/07/23 0206 05/07/23 0608  WBC 11.9*  --   --  12.4*  NEUTROABS 8.2*  --   --  11.7*  HGB 13.5 14.3 14.6 13.4  HCT 41.4 42.0 43.0 42.9  MCV 92.8  --   --  95.1  PLT 305  --   --  294    Basic Metabolic Panel: Recent Labs  Lab 05/07/23 0002 05/07/23 0021 05/07/23 0206 05/07/23 0608 05/08/23 0259  NA 139 140 140 135 135  K 3.0* 3.2* 3.5 3.7 4.3  CL 105  --   --  104 102  CO2 25  --   --  20* 25  GLUCOSE 122*  --   --  191* 127*  BUN 7  --   --  8 11  CREATININE 1.00  --   --  0.98 0.84  CALCIUM 8.3*  --   --  8.4* 8.6*  MG  --   --   --  2.1  --   PHOS  --   --   --  2.4*  --     Liver Function Tests: Recent Labs  Lab 05/07/23 0608  AST 22  ALT 18  ALKPHOS 76  BILITOT 0.3  PROT 7.1  ALBUMIN 3.6    CBG: Recent Labs  Lab 05/08/23 1229 05/08/23 1816 05/08/23 2026 05/09/23 0752 05/09/23 1127  GLUCAP 137* 112* 122* 98 109*    Hgb A1c Recent Labs    05/07/23 0755  HGBA1C 6.1*    Urinalysis    Component Value Date/Time   COLORURINE STRAW (A) 05/07/2023 1442   APPEARANCEUR CLEAR 05/07/2023 1442   LABSPEC 1.005 05/07/2023 1442   PHURINE 6.0 05/07/2023 1442   GLUCOSEU 50 (A) 05/07/2023 1442   HGBUR NEGATIVE 05/07/2023 1442   BILIRUBINUR NEGATIVE 05/07/2023 1442   KETONESUR NEGATIVE 05/07/2023 1442   PROTEINUR NEGATIVE 05/07/2023 1442   NITRITE NEGATIVE 05/07/2023 1442   LEUKOCYTESUR NEGATIVE 05/07/2023 1442    Discussed in detail with patient's girlfriend at bedside.  Time coordinating discharge: 25 minutes  SIGNED:  Marcellus Scott, MD,  FACP, [facility], [facility], [facility], [facility]   [facility] Ad[facility] [place]     To contact the attending provider between 7A-7P or the  covering provider during after hours 7P-7A, please log into the web site [email or web address] and  access using universal [place] password for that web site. If you do not have the password, please call the hospital operator.

## 2023-05-09 NOTE — Plan of Care (Signed)
Patient awake and alert orientx4. Denies pain. Remained on RA. Verbalized understanding of discharge instructions.  Patient stated that he will not be going to the shelter but he will be going back to the tent. Patient left unit via ambulatory. No signs or symptoms of acute distress at the time of discharge.  Problem: Education: Goal: Knowledge of General Education information will improve Description: Including pain rating scale, medication(s)/side effects and non-pharmacologic comfort measures Outcome: Adequate for Discharge   Problem: Health Behavior/Discharge Planning: Goal: Ability to manage health-related needs will improve Outcome: Adequate for Discharge   Problem: Clinical Measurements: Goal: Ability to maintain clinical measurements within normal limits will improve Outcome: Adequate for Discharge Goal: Will remain free from infection Outcome: Adequate for Discharge Goal: Diagnostic test results will improve Outcome: Adequate for Discharge Goal: Respiratory complications will improve Outcome: Adequate for Discharge Goal: Cardiovascular complication will be avoided Outcome: Adequate for Discharge   Problem: Activity: Goal: Risk for activity intolerance will decrease Outcome: Adequate for Discharge   Problem: Nutrition: Goal: Adequate nutrition will be maintained Outcome: Adequate for Discharge   Problem: Coping: Goal: Level of anxiety will decrease Outcome: Adequate for Discharge   Problem: Elimination: Goal: Will not experience complications related to bowel motility Outcome: Adequate for Discharge Goal: Will not experience complications related to urinary retention Outcome: Adequate for Discharge   Problem: Pain Managment: Goal: General experience of comfort will improve Outcome: Adequate for Discharge   Problem: Safety: Goal: Ability to remain free from injury will improve Outcome: Adequate for Discharge   Problem: Skin Integrity: Goal: Risk for impaired  skin integrity will decrease Outcome: Adequate for Discharge   Problem: Education: Goal: Ability to describe self-care measures that may prevent or decrease complications (Diabetes Survival Skills Education) will improve Outcome: Adequate for Discharge Goal: Individualized Educational Video(s) Outcome: Adequate for Discharge   Problem: Coping: Goal: Ability to adjust to condition or change in health will improve Outcome: Adequate for Discharge   Problem: Fluid Volume: Goal: Ability to maintain a balanced intake and output will improve Outcome: Adequate for Discharge   Problem: Health Behavior/Discharge Planning: Goal: Ability to identify and utilize available resources and services will improve Outcome: Adequate for Discharge Goal: Ability to manage health-related needs will improve Outcome: Adequate for Discharge   Problem: Metabolic: Goal: Ability to maintain appropriate glucose levels will improve Outcome: Adequate for Discharge   Problem: Nutritional: Goal: Maintenance of adequate nutrition will improve Outcome: Adequate for Discharge Goal: Progress toward achieving an optimal weight will improve Outcome: Adequate for Discharge   Problem: Skin Integrity: Goal: Risk for impaired skin integrity will decrease Outcome: Adequate for Discharge   Problem: Tissue Perfusion: Goal: Adequacy of tissue perfusion will improve Outcome: Adequate for Discharge

## 2023-05-09 NOTE — TOC Initial Note (Addendum)
Transition of Care [facility]) - Initial/Assessment Note    Patient Details  Name: Jimmy Herring MRN: [id number] Date of Birth: Mar 31, 1976  Transition of Care [facility]) CM/SW Contact:    Adrian Prows, RN Phone Number: 05/09/2023, 10:54 AM  Clinical Narrative:                 [facility] consult for d/c planning; spoke w/ pt in room; pt says he lives in tent w/ girlfriend; he identified POC sister Johnella Moloney [phone]); pt says he does not have insurance or PCP; pt denies IPV; he says he does experience food and housing insecurities, he does not have utilities; pt says he has transportation; pt says he does not have glasses, dentures, or HA; he does not have dme, HH services, or home oxygen; pt denies resource for [employer]; he agrees to receive resources for [employer] for PCP, Guide to [facility], and Corporate investment banker; pt given [employer] in [place], and [employer] [facility] in [place] will make appointments at agencies of choice; pt says he cannot afford medication; he does not work; pt does not have PCP or Insurance; see MATCH Letter; no TOC needs. Expected Discharge Plan: Home/Self Care Barriers to Discharge: No Barriers Identified   Patient Goals and CMS Choice Patient states their goals for this hospitalization and ongoing recovery are:: back to tent with girlfriend          Expected Discharge Plan and Services   Discharge Planning Services: CM Consult Post Acute Care Choice: NA Living arrangements for the past 2 months: No permanent address (patient lives in tent w/ girlfriend)                 DME Arranged: N/A DME Agency: NA       HH Arranged: NA HH Agency: NA        Prior Living Arrangements/Services Living arrangements for the past 2 months: No permanent address (patient lives in tent w/ girlfriend) Lives with:: Significant Other Patient language and need for interpreter reviewed::  No Do you feel safe going back to the place where you live?: Yes      Need for Family Participation in Patient Care: Yes (Comment) Care giver support system in place?: Yes (comment) Current home services:  (n/a) Criminal Activity/Legal Involvement Pertinent to Current Situation/Hospitalization: No - Comment as needed  Activities of Daily Living Home Assistive Devices/Equipment: None ADL Screening (condition at time of admission) Patient's cognitive ability adequate to safely complete daily activities?: Yes Is the patient deaf or have difficulty hearing?: No Does the patient have difficulty seeing, even when wearing glasses/contacts?: No Does the patient have difficulty concentrating, remembering, or making decisions?: No Patient able to express need for assistance with ADLs?: Yes Does the patient have difficulty dressing or bathing?: No Independently performs ADLs?: Yes (appropriate for developmental age) Does the patient have difficulty walking or climbing stairs?: No Weakness of Legs: None Weakness of Arms/Hands: None  Permission Sought/Granted Permission sought to share information with : Case Manager Permission granted to share information with : Yes, Verbal Permission Granted  Share Information with NAME: Case Manager     Permission granted to share info w Relationship: Johnella Moloney [phone]     Emotional Assessment Appearance:: Appears stated age Attitude/Demeanor/Rapport: Gracious Affect (typically observed): Accepting Orientation: : Oriented to Self, Oriented to Place, Oriented to  Time, Oriented to Situation Alcohol / Substance Use: Not Applicable Psych Involvement: No (comment)  Admission  diagnosis:  Severe persistent asthma with exacerbation [J45.51] Moderate persistent asthma with status asthmaticus [J45.42] Asthma exacerbation [J45.901] Patient Active Problem List   Diagnosis Date Noted   Hypokalemia 05/07/2023   Leukocytosis 05/07/2023   Tobacco abuse  05/07/2023   Asthma 08/12/2021   Asthma exacerbation 08/11/2021   Alcohol use 08/11/2021   Cocaine use 08/11/2021   PCP:  Pcp, No Pharmacy:   [id number] [place] - [address] AT [address] [place] [address] Phone: [phone] Fax: [phone]  [facility] [address] [place] [address] Phone: [phone] Fax: [phone]  [id number] [place], [place] - [address] [place] [address] Phone: [phone] Fax: [phone]     Social Determinants of Health (SDOH) Social History: SDOH Screenings   Food Insecurity: Food Insecurity Present (05/08/2023)  Housing: High Risk (05/07/2023)  Transportation Needs: Unmet Transportation Needs (05/07/2023)  Utilities: Not At Risk (05/08/2023)  Tobacco Use: High Risk (05/07/2023)   SDOH Interventions:     Readmission Risk Interventions     No data to display

## 2023-05-09 NOTE — TOC Progression Note (Signed)
   MATCH Medication Assistance Card  Name: Orland Cariker ID (MRN): [id number]   Bin: [id number]  RX Group: [id number]  Discharge Date: 05/09/23 Expiration Date: 05/15/23 (must be filled within 7 days of discharge)  Dear Jimmy Herring:  You have been approved to have the prescriptions written by your discharging physician filled through our [facility] (Medication Assistance Through [facility]) program. This program allows for a one-time (no refills) 34-day supply of selected medications for a low copay amount.  The copay is $0.00 per prescription.  Only certain pharmacies are participating in this program with [facility]. You will need to select one of the pharmacies from the attached list and take your prescriptions, this letter, and your photo ID to one of the participating pharmacies.   We are excited that you are able to use the [facility] program to get your medications. These prescriptions must be filled within 7 days of hospital discharge or they will no longer be valid for the [facility] program. Should you have any problems with your prescriptions please contact your case management team member at [phone] for [place]/ [facility].  Thank you, [facility] Care ManagementTransition of Care [facility]) - Progression Note    Patient Details  Name: Jimmy Herring MRN: [id number] Date of Birth: December 17, 1975  Transition of Care [facility]) CM/SW Contact  Adrian Prows, RN Phone Number: 05/09/2023, 12:05 PM  Clinical Narrative:       Expected Discharge Plan: Home/Self Care Barriers to Discharge: No Barriers Identified  Expected Discharge Plan and Services   Discharge Planning Services: CM Consult Post Acute Care Choice: NA Living arrangements for the past 2 months: No permanent address (patient lives in tent w/ girlfriend)                 DME Arranged: N/A DME Agency: NA       HH Arranged: NA HH Agency: NA         Social Determinants of  Health (SDOH) Interventions SDOH Screenings   Food Insecurity: Food Insecurity Present (05/09/2023)  Housing: High Risk (05/09/2023)  Transportation Needs: Unmet Transportation Needs (05/09/2023)  Utilities: At Risk (05/09/2023)  Tobacco Use: High Risk (05/07/2023)    Readmission Risk Interventions     No data to display

## 2024-01-23 ENCOUNTER — Emergency Department (HOSPITAL_COMMUNITY): Payer: Self-pay

## 2024-01-23 ENCOUNTER — Encounter (HOSPITAL_COMMUNITY): Payer: Self-pay | Admitting: Emergency Medicine

## 2024-01-23 ENCOUNTER — Other Ambulatory Visit: Payer: Self-pay

## 2024-01-23 ENCOUNTER — Observation Stay (HOSPITAL_COMMUNITY)
Admission: EM | Admit: 2024-01-23 | Discharge: 2024-01-24 | Disposition: A | Discharge status: Home / Self Care | Payer: Self-pay | Attending: Internal Medicine | Admitting: Internal Medicine

## 2024-01-23 DIAGNOSIS — D72829 Elevated white blood cell count, unspecified: Secondary | ICD-10-CM | POA: Insufficient documentation

## 2024-01-23 DIAGNOSIS — Z79899 Other long term (current) drug therapy: Secondary | ICD-10-CM | POA: Insufficient documentation

## 2024-01-23 DIAGNOSIS — Z5900 Homelessness unspecified: Secondary | ICD-10-CM | POA: Insufficient documentation

## 2024-01-23 DIAGNOSIS — J4541 Moderate persistent asthma with (acute) exacerbation: Secondary | ICD-10-CM

## 2024-01-23 DIAGNOSIS — J45901 Unspecified asthma with (acute) exacerbation: Principal | ICD-10-CM | POA: Diagnosis present

## 2024-01-23 DIAGNOSIS — F17210 Nicotine dependence, cigarettes, uncomplicated: Secondary | ICD-10-CM | POA: Insufficient documentation

## 2024-01-23 DIAGNOSIS — Z1152 Encounter for screening for COVID-19: Secondary | ICD-10-CM | POA: Insufficient documentation

## 2024-01-23 LAB — RESP PANEL BY RT-PCR (RSV, FLU A&B, COVID)  RVPGX2
Influenza A by PCR: NEGATIVE
Influenza B by PCR: NEGATIVE
Resp Syncytial Virus by PCR: NEGATIVE
SARS Coronavirus 2 by RT PCR: NEGATIVE

## 2024-01-23 LAB — BASIC METABOLIC PANEL
Anion gap: 9 (ref 5–15)
BUN: 8 mg/dL (ref 6–20)
CO2: 25 mmol/L (ref 22–32)
Calcium: 9.1 mg/dL (ref 8.9–10.3)
Chloride: 106 mmol/L (ref 98–111)
Creatinine, Ser: 0.92 mg/dL (ref 0.61–1.24)
GFR, Estimated: >60 mL/min (ref >60)
Glucose, Bld: 94 mg/dL (ref 70–99)
Potassium: 4.1 mmol/L (ref 3.5–5.1)
Sodium: 140 mmol/L (ref 135–145)

## 2024-01-23 LAB — CBC
HCT: 43.6 % (ref 39.0–52.0)
Hemoglobin: 13.8 g/dL (ref 13.0–17.0)
MCH: 29.9 pg (ref 26.0–34.0)
MCHC: 31.7 g/dL (ref 30.0–36.0)
MCV: 94.6 fL (ref 80.0–100.0)
Platelets: 325 10*3/uL (ref 150–400)
RBC: 4.61 MIL/uL (ref 4.22–5.81)
RDW: 12.9 % (ref 11.5–15.5)
WBC: 15.5 10*3/uL — ABNORMAL HIGH (ref 4.0–10.5)
nRBC: 0 % (ref 0.0–0.2)

## 2024-01-23 LAB — HIV ANTIBODY (ROUTINE TESTING W REFLEX): HIV Screen 4th Generation wRfx: NONREACTIVE

## 2024-01-23 MED ORDER — IPRATROPIUM BROMIDE 0.02 % IN SOLN
0.5000 mg | Freq: Once | RESPIRATORY_TRACT | Status: AC
  Administered 2024-01-23: 0.5 mg via RESPIRATORY_TRACT
  Filled 2024-01-23: qty 2.5

## 2024-01-23 MED ORDER — ORAL CARE MOUTH RINSE: 15.0000 mL | OROMUCOSAL | Status: DC | PRN

## 2024-01-23 MED ORDER — MOMETASONE FURO-FORMOTEROL FUM 200-5 MCG/ACT IN AERO
2.0000 | INHALATION_SPRAY | Freq: Two times a day (BID) | RESPIRATORY_TRACT | Status: DC
  Administered 2024-01-23 – 2024-01-24 (×2): 2 via RESPIRATORY_TRACT
  Filled 2024-01-23: qty 8.8

## 2024-01-23 MED ORDER — ALBUTEROL SULFATE (2.5 MG/3ML) 0.083% IN NEBU
15.0000 mg/h | INHALATION_SOLUTION | Freq: Once | RESPIRATORY_TRACT | Status: AC
  Administered 2024-01-23: 15 mg/h via RESPIRATORY_TRACT
  Filled 2024-01-23: qty 18

## 2024-01-23 MED ORDER — METHYLPREDNISOLONE SODIUM SUCC 40 MG IJ SOLR
40.0000 mg | Freq: Two times a day (BID) | INTRAMUSCULAR | Status: DC
  Administered 2024-01-23: 40 mg via INTRAVENOUS
  Filled 2024-01-23: qty 1

## 2024-01-23 MED ORDER — ONDANSETRON HCL 4 MG/2ML IJ SOLN
4.0000 mg | Freq: Four times a day (QID) | INTRAMUSCULAR | Status: DC | PRN
Start: 2024-01-23 — End: 2024-01-24

## 2024-01-23 MED ORDER — ONDANSETRON HCL 4 MG PO TABS: 4.0000 mg | ORAL_TABLET | Freq: Four times a day (QID) | ORAL | Status: DC | PRN

## 2024-01-23 MED ORDER — ALBUTEROL SULFATE (2.5 MG/3ML) 0.083% IN NEBU: 2.5000 mg | INHALATION_SOLUTION | RESPIRATORY_TRACT | Status: DC | PRN

## 2024-01-23 MED ORDER — ACETAMINOPHEN 650 MG RE SUPP: 650.0000 mg | Freq: Four times a day (QID) | RECTAL | Status: DC | PRN

## 2024-01-23 MED ORDER — ENOXAPARIN SODIUM 40 MG/0.4ML IJ SOSY
40.0000 mg | PREFILLED_SYRINGE | INTRAMUSCULAR | Status: DC
  Filled 2024-01-23: qty 0.4

## 2024-01-23 MED ORDER — TRAZODONE HCL 50 MG PO TABS: 25.0000 mg | ORAL_TABLET | Freq: Every evening | ORAL | Status: DC | PRN

## 2024-01-23 MED ORDER — ACETAMINOPHEN 325 MG PO TABS
650.0000 mg | ORAL_TABLET | Freq: Four times a day (QID) | ORAL | Status: DC | PRN
  Administered 2024-01-24: 650 mg via ORAL
  Filled 2024-01-23: qty 2

## 2024-01-23 MED ORDER — MAGNESIUM SULFATE 2 GM/50ML IV SOLN
2.0000 g | Freq: Once | INTRAVENOUS | Status: AC
  Administered 2024-01-23: 2 g via INTRAVENOUS
  Filled 2024-01-23: qty 50

## 2024-01-23 NOTE — Plan of Care (Signed)
  Problem: Clinical Measurements: Goal: Respiratory complications will improve Outcome: Progressing   Problem: Clinical Measurements: Goal: Respiratory complications will improve Outcome: Progressing   Problem: Education: Goal: Knowledge of General Education information will improve Description: Including pain rating scale, medication(s)/side effects and non-pharmacologic comfort measures Outcome: Not Progressing   Problem: Health Behavior/Discharge Planning: Goal: Ability to manage health-related needs will improve Outcome: Not Progressing

## 2024-01-23 NOTE — H&P (Signed)
 History and Physical  Jimmy Herring [place] NWG:[id number] DOB: 1976-07-18 DOA: 01/23/2024  PCP: Pcp, No   Chief Complaint: Shortness of breath  HPI: Jimmy Herring is a 48 y.o. male with medical history significant for tobacco abuse, homelessness, medication noncompliance due to lack of resources as well as moderate persistent asthma admitted to the hospital with asthma exacerbation.  Patient states that for the last 2 or 3 weeks she has been having some increased cough dyspnea, and some bodyaches.  Denies any fevers, chest pain.  Cough is nonproductive.  Denies any nausea, vomiting, diarrhea or other concerns.  This morning, he woke up feeling particularly [place] of breath and called EMS.  He was given IV Solu-Medrol, albuterol treatment, and 2 DuoNebs en route.  Upon arrival to the emergency department, workup as detailed below was unremarkable.  He was also given a dose of magnesium sulfate.  Currently he is resting on room air and has no complaints, states that he feels much better.  Review of Systems: Please see HPI for pertinent positives and negatives. A complete 10 system review of systems are otherwise negative.  Past Medical History:  Diagnosis Date   Asthma    History reviewed. No pertinent surgical history. Social History:  reports that he has been smoking cigarettes. He has never used smokeless tobacco. He reports current alcohol use. He reports that he does not currently use drugs after having used the following drugs: Cocaine.  No Known Allergies  Family History  Problem Relation Age of Onset   COPD Sister    Asthma Sister      Prior to Admission medications   Medication Sig Start Date End Date Taking? Authorizing Provider  albuterol (VENTOLIN HFA) 108 (90 Base) MCG/ACT inhaler Inhale 1-2 puffs into the lungs every 6 (six) hours as needed for wheezing or shortness of breath. 05/09/23  Yes Hongalgi, Maximino Greenland, MD  budesonide-formoterol (SYMBICORT) 160-4.5 MCG/ACT inhaler Inhale  2 puffs into the lungs in the morning and at bedtime. 05/09/23  Yes Hongalgi, Maximino Greenland, MD  ibuprofen (IBU) 600 MG tablet Take 1 tablet (600 mg total) by mouth every 8 (eight) hours as needed for mild pain. 08/13/21  Yes Vann, Jessica U, DO  nicotine (NICODERM CQ - DOSED IN MG/24 HOURS) 14 mg/24hr patch Place 1 patch (14 mg total) onto the skin daily as needed (smoking cessation). Patient not taking: Reported on 01/23/2024 05/09/23   Elease Etienne, MD  pantoprazole (PROTONIX) 40 MG tablet Take 1 tablet (40 mg total) by mouth daily. Patient not taking: Reported on 01/23/2024 05/09/23 06/08/23  Elease Etienne, MD  predniSONE (DELTASONE) 10 MG tablet Take 4 tabs (40 mg total) daily for 3 days, then 3 tabs (30 mg total) daily for 3 days, then 2 tabs (20 mg total) daily for 3 days, then 1 tab (10 mg total) daily for 3 days, then stop. Patient not taking: Reported on 01/23/2024 05/10/23   Elease Etienne, MD    Physical Exam: BP 114/76 (BP Location: Right Arm)   Pulse 81   Temp (!) 97.5 F (36.4 C) (Oral)   Resp 19   Wt 75 kg   SpO2 98%   BMI 24.42 kg/m  General:  Alert, oriented, calm, in no acute distress, significant other at the bedside.  Napping on my arrival on room air. Cardiovascular: RRR, no murmurs or rubs, no peripheral edema  Respiratory: clear to auscultation bilaterally, diminished bilateral breath sounds, but with good equal air entry, with diffuse  rhonchi and wheezing.  Not tachypneic and not in any respiratory distress. Abdomen: soft, nontender, nondistended, normal bowel tones heard  Skin: dry, no rashes  Musculoskeletal: no joint effusions, normal range of motion  Psychiatric: appropriate affect, normal speech  Neurologic: extraocular muscles intact, clear speech, moving all extremities with intact sensorium         Labs on Admission:  Basic Metabolic Panel: Recent Labs  Lab 01/23/24 0631  NA 140  K 4.1  CL 106  CO2 25  GLUCOSE 94  BUN 8  CREATININE 0.92  CALCIUM  9.1   Liver Function Tests: No results for input(s): "AST", "ALT", "ALKPHOS", "BILITOT", "PROT", "ALBUMIN" in the last 168 hours. No results for input(s): "LIPASE", "AMYLASE" in the last 168 hours. No results for input(s): "AMMONIA" in the last 168 hours. CBC: Recent Labs  Lab 01/23/24 0631  WBC 15.5*  HGB 13.8  HCT 43.6  MCV 94.6  PLT 325   Cardiac Enzymes: No results for input(s): "CKTOTAL", "CKMB", "CKMBINDEX", "TROPONINI" in the last 168 hours. BNP (last 3 results) No results for input(s): "BNP" in the last 8760 hours.  ProBNP (last 3 results) No results for input(s): "PROBNP" in the last 8760 hours.  CBG: No results for input(s): "GLUCAP" in the last 168 hours.  Radiological Exams on Admission: DG Chest Portable 1 View Result Date: 01/23/2024 CLINICAL DATA:  Shortness of breath EXAM: PORTABLE CHEST 1 VIEW COMPARISON:  05/06/2023 FINDINGS: Artifact from EKG leads. Normal heart size and mediastinal contours. No acute infiltrate or edema. No effusion or pneumothorax. No acute osseous findings. IMPRESSION: No active disease. Electronically Signed   By: Tiburcio Pea M.D.   On: 01/23/2024 07:27   Assessment/Plan Jimmy Herring is a 48 y.o. male with medical history significant for tobacco abuse, homelessness, medication noncompliance due to lack of resources as well as moderate persistent asthma admitted to the hospital with asthma exacerbation.   Acute exacerbation of asthma-with increased cough, dyspnea and wheezing.  He has been out of his Symbicort for the last couple of weeks, this may be the cause of exacerbation. -Observation admission -Supplemental oxygen if needed -Dulera twice daily, with albuterol as needed -IV Solu-Medrol every 12 hours -Follow-up viral respiratory panel  Leukocytosis-I suspect this is reactive, no evidence of acute bacterial infection  Homelessness-resulting in medication noncompliance, TOC consult  DVT prophylaxis: Lovenox     Code  Status: Full Code  Consults called: None  Admission status: Observation  Time spent: 48 minutes  Sandrika Schwinn Sharlette Dense MD [facility] Pager [phone]  If 7PM-7AM, please contact night-coverage [email or web address] Password [facility]  01/23/2024, 9:56 AM

## 2024-01-23 NOTE — ED Triage Notes (Addendum)
 Patient BIB [facility] c/o waking up sob.  Patient reports that he has been having flu like symptoms x 1 week. Patient reports being out of symbicort x 2 months as he just got released from jail.  EMS- 2 duo nebs, 1 albuterol treatment, 125 mg solu-medrol  98% RA 130 HR 160/110  20 L hand

## 2024-01-23 NOTE — ED Provider Notes (Signed)
 [place] EMERGENCY DEPARTMENT AT [facility] Provider Note   CSN: [id number] Arrival date & time: 01/23/24  2725     History  Chief Complaint  Patient presents with   Shortness of Breath    Jimmy Herring is a 48 y.o. male.  This is a 48 year old male presenting emergency department for shortness of breath.  History of asthma.  Reportedly has been having flulike symptoms for the past week, rhinorrhea.  Has been out of Symbicort.  Reportedly took inhaler without improvement.  EMS gave DuoNeb's and Solu-Medrol prior to arrival, little improvement.   Shortness of Breath      Home Medications Prior to Admission medications   Medication Sig Start Date End Date Taking? Authorizing Provider  albuterol (VENTOLIN HFA) 108 (90 Base) MCG/ACT inhaler Inhale 1-2 puffs into the lungs every 6 (six) hours as needed for wheezing or shortness of breath. 05/09/23   Hongalgi, Maximino Greenland, MD  budesonide-formoterol (SYMBICORT) 160-4.5 MCG/ACT inhaler Inhale 2 puffs into the lungs in the morning and at bedtime. 05/09/23   Hongalgi, Maximino Greenland, MD  ibuprofen (IBU) 600 MG tablet Take 1 tablet (600 mg total) by mouth every 8 (eight) hours as needed for mild pain. 08/13/21   Joseph Art, DO  nicotine (NICODERM CQ - DOSED IN MG/24 HOURS) 14 mg/24hr patch Place 1 patch (14 mg total) onto the skin daily as needed (smoking cessation). 05/09/23   Hongalgi, Maximino Greenland, MD  pantoprazole (PROTONIX) 40 MG tablet Take 1 tablet (40 mg total) by mouth daily. 05/09/23 06/08/23  Hongalgi, Maximino Greenland, MD  predniSONE (DELTASONE) 10 MG tablet Take 4 tabs (40 mg total) daily for 3 days, then 3 tabs (30 mg total) daily for 3 days, then 2 tabs (20 mg total) daily for 3 days, then 1 tab (10 mg total) daily for 3 days, then stop. 05/10/23   Hongalgi, Maximino Greenland, MD      Allergies    Patient has no known allergies.    Review of Systems   Review of Systems  Respiratory:  Positive for shortness of breath.     Physical  Exam Updated Vital Signs BP (!) 125/96 (BP Location: Right Arm)   Pulse (!) 123   Temp 98.1 F (36.7 C) (Oral)   Resp (!) 30   Wt 75 kg   SpO2 96%   BMI 24.42 kg/m  Physical Exam Vitals and nursing note reviewed.  Constitutional:      General: He is in acute distress.  HENT:     Head: Normocephalic.  Cardiovascular:     Rate and Rhythm: Regular rhythm. Tachycardia present.  Pulmonary:     Effort: Tachypnea, accessory muscle usage and respiratory distress present.     Breath sounds: Decreased breath sounds and wheezing present.  Musculoskeletal:     Cervical back: Normal range of motion.     Right lower leg: No edema.     Left lower leg: No edema.  Skin:    General: Skin is warm.     Capillary Refill: Capillary refill takes less than 2 seconds.  Neurological:     Mental Status: He is alert and oriented to person, place, and time.  Psychiatric:        Mood and Affect: Mood normal.        Behavior: Behavior normal.     ED Results / Procedures / Treatments   Labs (all labs ordered are listed, but only abnormal results are displayed) Labs Reviewed  CBC  BASIC METABOLIC PANEL    EKG EKG Interpretation Date/Time:  Sunday January 23 2024 06:33:22 EDT Ventricular Rate:  126 PR Interval:  150 QRS Duration:  80 QT Interval:  304 QTC Calculation: 441 R Axis:   93  Text Interpretation: Sinus tachycardia Ventricular premature complex Aberrant conduction of SV complex(es) LAE, consider biatrial enlargement Borderline right axis deviation Confirmed by Estanislado Pandy [phone]) on 01/23/2024 6:40:27 AM  Radiology No results found.  Procedures Procedures    Medications Ordered in ED Medications  magnesium sulfate IVPB 2 g 50 mL (2 g Intravenous New Bag/Given 01/23/24 0649)  albuterol (PROVENTIL) (2.5 MG/3ML) 0.083% nebulizer solution (15 mg/hr Nebulization Given 01/23/24 0652)  ipratropium (ATROVENT) nebulizer solution 0.5 mg (0.5 mg Nebulization Given 01/23/24 6440)    ED  Course/ Medical Decision Making/ A&P                                 Medical Decision Making 48 year old male presenting emergency department for shortness of breath in the setting of viral URI symptoms for the past several days.  EMS reported giving 2 DuoNeb's DuoNebs, and Solu-Medrol prior to arrival.  Patient in moderate to severe respiratory distress sitting on edge of bed tripoding, poor air movement with diffuse wheezing.  Speaking in [place] sentences.  Hour-long breathing treatment ordered as long as magnesium.  Per chart review was admitted last year for asthma exacerbation was on BiPAP.  He denies prior intubations.  Will get basic labs, chest x-ray.  Anticipate admission.Care signed out to morning team.   Amount and/or Complexity of Data Reviewed External Data Reviewed:     Details: Appears admission last year for asthma exacerbation.  On BiPAP at that time. Labs: ordered. Decision-making details documented in ED Course. Radiology: ordered.  Risk Prescription drug management. Diagnosis or treatment significantly limited by social determinants of health. Risk Details: Per prior admission note patient with some socioeconomic issues/homelessness. Triage note states patient recently released from jail.          Final Clinical Impression(s) / ED Diagnoses Final diagnoses:  None    Rx / DC Orders ED Discharge Orders     None         Coral Spikes, DO 01/23/24 [phone]

## 2024-01-24 ENCOUNTER — Other Ambulatory Visit (HOSPITAL_COMMUNITY): Payer: Self-pay

## 2024-01-24 LAB — CBC
HCT: 43.7 % (ref 39.0–52.0)
Hemoglobin: 13.7 g/dL (ref 13.0–17.0)
MCH: 29.8 pg (ref 26.0–34.0)
MCHC: 31.4 g/dL (ref 30.0–36.0)
MCV: 95 fL (ref 80.0–100.0)
Platelets: 354 10*3/uL (ref 150–400)
RBC: 4.6 MIL/uL (ref 4.22–5.81)
RDW: 13.3 % (ref 11.5–15.5)
WBC: 13.5 10*3/uL — ABNORMAL HIGH (ref 4.0–10.5)
nRBC: 0 % (ref 0.0–0.2)

## 2024-01-24 LAB — BASIC METABOLIC PANEL
Anion gap: 9 (ref 5–15)
BUN: 19 mg/dL (ref 6–20)
CO2: 23 mmol/L (ref 22–32)
Calcium: 9.2 mg/dL (ref 8.9–10.3)
Chloride: 103 mmol/L (ref 98–111)
Creatinine, Ser: 0.92 mg/dL (ref 0.61–1.24)
GFR, Estimated: >60 mL/min (ref >60)
Glucose, Bld: 127 mg/dL — ABNORMAL HIGH (ref 70–99)
Potassium: 4.9 mmol/L (ref 3.5–5.1)
Sodium: 135 mmol/L (ref 135–145)

## 2024-01-24 MED ORDER — ALBUTEROL SULFATE HFA 108 (90 BASE) MCG/ACT IN AERS: 1.0000 | INHALATION_SPRAY | Freq: Four times a day (QID) | RESPIRATORY_TRACT | 2 refills | Status: DC | PRN

## 2024-01-24 MED ORDER — NICOTINE 14 MG/24HR TD PT24
14.0000 mg | MEDICATED_PATCH | Freq: Every day | TRANSDERMAL | 0 refills | Status: DC | PRN
  Filled 2024-01-24: qty 28, 28d supply, fill #0

## 2024-01-24 MED ORDER — BUDESONIDE-FORMOTEROL FUMARATE 160-4.5 MCG/ACT IN AERO: 2.0000 | INHALATION_SPRAY | Freq: Two times a day (BID) | RESPIRATORY_TRACT | 2 refills | Status: DC

## 2024-01-24 MED ORDER — PREDNISONE 10 MG PO TABS: ORAL_TABLET | ORAL | 0 refills | Status: DC

## 2024-01-24 MED ORDER — ALBUTEROL SULFATE HFA 108 (90 BASE) MCG/ACT IN AERS
1.0000 | INHALATION_SPRAY | Freq: Four times a day (QID) | RESPIRATORY_TRACT | 2 refills | Status: AC | PRN
  Filled 2024-01-24: qty 6.7, 25d supply, fill #0

## 2024-01-24 MED ORDER — PREDNISONE 10 MG PO TABS
ORAL_TABLET | ORAL | 0 refills | Status: AC
Start: 2024-01-24 — End: ?
  Filled 2024-01-24: qty 30, 12d supply, fill #0

## 2024-01-24 MED ORDER — PREDNISONE 10 MG PO TABS
ORAL_TABLET | ORAL | 0 refills | Status: DC
  Filled 2024-01-24: qty 30, fill #0

## 2024-01-24 MED ORDER — ALBUTEROL SULFATE HFA 108 (90 BASE) MCG/ACT IN AERS
1.0000 | INHALATION_SPRAY | Freq: Four times a day (QID) | RESPIRATORY_TRACT | 2 refills | Status: DC | PRN
  Filled 2024-01-24: qty 18, 25d supply, fill #0

## 2024-01-24 MED ORDER — BUDESONIDE-FORMOTEROL FUMARATE 160-4.5 MCG/ACT IN AERO
2.0000 | INHALATION_SPRAY | Freq: Two times a day (BID) | RESPIRATORY_TRACT | 2 refills | Status: AC
  Filled 2024-01-24: qty 10.2, 30d supply, fill #0

## 2024-01-24 MED ORDER — NICOTINE 14 MG/24HR TD PT24
14.0000 mg | MEDICATED_PATCH | Freq: Every day | TRANSDERMAL | 0 refills | Status: AC | PRN
  Filled 2024-01-24: qty 28, 28d supply, fill #0

## 2024-01-24 MED ORDER — BUDESONIDE-FORMOTEROL FUMARATE 160-4.5 MCG/ACT IN AERO
2.0000 | INHALATION_SPRAY | Freq: Two times a day (BID) | RESPIRATORY_TRACT | 2 refills | Status: DC
  Filled 2024-01-24: qty 10.2, 30d supply, fill #0

## 2024-01-24 NOTE — Plan of Care (Signed)

## 2024-01-24 NOTE — Discharge Summary (Signed)
 Physician Discharge Summary  Jimmy Herring ION:[id number] DOB: 1976/08/28 DOA: 01/23/2024  PCP: Pcp, No  Admit date: 01/23/2024 Discharge date: 01/24/2024  Admitted From: home Disposition:  home  Recommendations for Outpatient Follow-up:  Follow up with PCP in 1-2 weeks  Home Health: none Equipment/Devices: none  Discharge Condition: stable CODE STATUS: Full code Diet Orders (From admission, onward)     Start     Ordered   01/23/24 0956  Diet regular Room service appropriate? Yes; Fluid consistency: Thin  Diet effective now       Question Answer Comment  Room service appropriate? Yes   Fluid consistency: Thin      01/23/24 0955            HPI: Per admitting MD,  Jimmy Herring is a 48 y.o. male with medical history significant for tobacco abuse, homelessness, medication noncompliance due to lack of resources as well as moderate persistent asthma admitted to the hospital with asthma exacerbation.  Patient states that for the last 2 or 3 weeks she has been having some increased cough dyspnea, and some bodyaches.  Denies any fevers, chest pain.  Cough is nonproductive.  Denies any nausea, vomiting, diarrhea or other concerns.  This morning, he woke up feeling particularly [place] of breath and called EMS.  He was given IV Solu-Medrol, albuterol treatment, and 2 DuoNebs en route.  Upon arrival to the emergency department, workup as detailed below was unremarkable.  He was also given a dose of magnesium sulfate.  Currently he is resting on room air and has no complaints, states that he feels much better.   Hospital Course / Discharge diagnoses: Principal problem Asthma exacerbation -patient was admitted to the hospital with mild asthma exacerbation in the setting of running out of his home medications, his medical care is complicated by ongoing tobacco use, homelessness, medication nonadherence due to lack of resources.  He was placed on steroids, breathing treatments with  significant improvement in his respiratory status, on my evaluation he feels at baseline, he is dressed up and asking to go home as he has no further respiratory issues.  He will be discharged home in stable condition with a steroid taper, he will also have his home medications refilled  Active problems Leukocytosis-reactive Tobacco use-counseled for cessation, nicotine patch prescribed  Sepsis ruled out   Discharge Instructions   Allergies as of 01/24/2024   No Known Allergies      Medication List     STOP taking these medications    pantoprazole 40 MG tablet Commonly known as: Protonix       TAKE these medications    albuterol 108 (90 Base) MCG/ACT inhaler Commonly known as: VENTOLIN HFA Inhale 1-2 puffs into the lungs every 6 (six) hours as needed for wheezing or shortness of breath.   budesonide-formoterol 160-4.5 MCG/ACT inhaler Commonly known as: Symbicort Inhale 2 puffs into the lungs in the morning and at bedtime.   ibuprofen 600 MG tablet Commonly known as: IBU Take 1 tablet (600 mg total) by mouth every 8 (eight) hours as needed for mild pain.   nicotine 14 mg/24hr patch Commonly known as: NICODERM CQ - dosed in mg/24 hours Place 1 patch (14 mg total) onto the skin daily as needed (smoking cessation).   predniSONE 10 MG tablet Commonly known as: DELTASONE Take 4 tabs (40 mg total) daily for 3 days, then 3 tabs (30 mg total) daily for 3 days, then 2 tabs (20 mg total) daily for  3 days, then 1 tab (10 mg total) daily for 3 days, then stop.         Consultations: none  Procedures/Studies:  DG Chest Portable 1 View Result Date: 01/23/2024 CLINICAL DATA:  Shortness of breath EXAM: PORTABLE CHEST 1 VIEW COMPARISON:  05/06/2023 FINDINGS: Artifact from EKG leads. Normal heart size and mediastinal contours. No acute infiltrate or edema. No effusion or pneumothorax. No acute osseous findings. IMPRESSION: No active disease. Electronically Signed   By:  Tiburcio Pea M.D.   On: 01/23/2024 07:27    Subjective: - no chest pain, shortness of breath, no abdominal pain, nausea or vomiting.   Discharge Exam: BP (!) 138/91 (BP Location: Left Arm)   Pulse 90   Temp 98 F (36.7 C) (Oral)   Resp 16   Ht 5\' 8"  (1.727 m)   Wt 75 kg   SpO2 93%   BMI 25.14 kg/m   General: Pt is alert, awake, not in acute distress Cardiovascular: RRR, S1/S2 +, no rubs, no gallops Respiratory: Very faint end expiratory wheezing, moves air well Abdominal: Soft, NT, ND, bowel sounds + Extremities: no edema, no cyanosis  The results of significant diagnostics from this hospitalization (including imaging, microbiology, ancillary and laboratory) are listed below for reference.     Microbiology: Recent Results (from the past 240 hours)  Resp panel by RT-PCR (RSV, Flu A&B, Covid) Anterior Nasal Swab     Status: None   Collection Time: 01/23/24  9:10 AM   Specimen: Anterior Nasal Swab  Result Value Ref Range Status   SARS Coronavirus 2 by RT PCR NEGATIVE NEGATIVE Final    Comment: (NOTE) SARS-CoV-2 target nucleic acids are NOT DETECTED.  The SARS-CoV-2 RNA is generally detectable in upper respiratory specimens during the acute phase of infection. The lowest concentration of SARS-CoV-2 viral copies this assay can detect is 138 copies/mL. A negative result does not preclude SARS-Cov-2 infection and should not be used as the sole basis for treatment or other patient management decisions. A negative result may occur with  improper specimen collection/handling, submission of specimen other than nasopharyngeal swab, presence of viral mutation(s) within the areas targeted by this assay, and inadequate number of viral copies(<138 copies/mL). A negative result must be combined with clinical observations, patient history, and epidemiological information. The expected result is Negative.  Fact Sheet for Patients:  BloggerCourse.com  Fact  Sheet for Healthcare Providers:  SeriousBroker.it  This test is no t yet approved or cleared by the [place] FDA and  has been authorized for detection and/or diagnosis of SARS-CoV-2 by FDA under an Emergency Use Authorization (EUA). This EUA will remain  in effect (meaning this test can be used) for the duration of the COVID-19 declaration under Section 564(b)(1) of the Act, 21 U.S.C.section 360bbb-3(b)(1), unless the authorization is terminated  or revoked sooner.       Influenza A by PCR NEGATIVE NEGATIVE Final   Influenza B by PCR NEGATIVE NEGATIVE Final    Comment: (NOTE) The Xpert Xpress SARS-CoV-2/FLU/RSV plus assay is intended as an aid in the diagnosis of influenza from Nasopharyngeal swab specimens and should not be used as a sole basis for treatment. Nasal washings and aspirates are unacceptable for Xpert Xpress SARS-CoV-2/FLU/RSV testing.  Fact Sheet for Patients: BloggerCourse.com  Fact Sheet for Healthcare Providers: SeriousBroker.it  This test is not yet approved or cleared by the [place] FDA and has been authorized for detection and/or diagnosis of SARS-CoV-2 by FDA under an Emergency Use Authorization (  EUA). This EUA will remain in effect (meaning this test can be used) for the duration of the COVID-19 declaration under Section 564(b)(1) of the Act, 21 U.S.C. section 360bbb-3(b)(1), unless the authorization is terminated or revoked.     Resp Syncytial Virus by PCR NEGATIVE NEGATIVE Final    Comment: (NOTE) Fact Sheet for Patients: BloggerCourse.com  Fact Sheet for Healthcare Providers: SeriousBroker.it  This test is not yet approved or cleared by the [place] FDA and has been authorized for detection and/or diagnosis of SARS-CoV-2 by FDA under an Emergency Use Authorization (EUA). This EUA will remain in effect  (meaning this test can be used) for the duration of the COVID-19 declaration under Section 564(b)(1) of the Act, 21 U.S.C. section 360bbb-3(b)(1), unless the authorization is terminated or revoked.  Performed at [facility], [address], [place], [place] [address]      Labs: Basic Metabolic Panel: Recent Labs  Lab 01/23/24 0631 01/24/24 0403  NA 140 135  K 4.1 4.9  CL 106 103  CO2 25 23  GLUCOSE 94 127*  BUN 8 19  CREATININE 0.92 0.92  CALCIUM 9.1 9.2   Liver Function Tests: No results for input(s): "AST", "ALT", "ALKPHOS", "BILITOT", "PROT", "ALBUMIN" in the last 168 hours. CBC: Recent Labs  Lab 01/23/24 0631 01/24/24 0403  WBC 15.5* 13.5*  HGB 13.8 13.7  HCT 43.6 43.7  MCV 94.6 95.0  PLT 325 354   CBG: No results for input(s): "GLUCAP" in the last 168 hours. Hgb A1c No results for input(s): "HGBA1C" in the last 72 hours. Lipid Profile No results for input(s): "CHOL", "HDL", "LDLCALC", "TRIG", "CHOLHDL", "LDLDIRECT" in the last 72 hours. Thyroid function studies No results for input(s): "TSH", "T4TOTAL", "T3FREE", "THYROIDAB" in the last 72 hours.  Invalid input(s): "FREET3" Urinalysis    Component Value Date/Time   COLORURINE STRAW (A) 05/07/2023 1442   APPEARANCEUR CLEAR 05/07/2023 1442   LABSPEC 1.005 05/07/2023 1442   PHURINE 6.0 05/07/2023 1442   GLUCOSEU 50 (A) 05/07/2023 1442   HGBUR NEGATIVE 05/07/2023 1442   BILIRUBINUR NEGATIVE 05/07/2023 1442   KETONESUR NEGATIVE 05/07/2023 1442   PROTEINUR NEGATIVE 05/07/2023 1442   NITRITE NEGATIVE 05/07/2023 1442   LEUKOCYTESUR NEGATIVE 05/07/2023 1442    FURTHER DISCHARGE INSTRUCTIONS:   Get Medicines reviewed and adjusted: Please take all your medications with you for your next visit with your Primary MD   Laboratory/radiological data: Please request your Primary MD to go over all hospital tests and procedure/radiological results at the follow up, please ask your Primary MD  to get all Hospital records sent to his/her office.   In some cases, they will be blood work, cultures and biopsy results pending at the time of your discharge. Please request that your primary care M.D. goes through all the records of your hospital data and follows up on these results.   Also Note the following: If you experience worsening of your admission symptoms, develop shortness of breath, life threatening emergency, suicidal or homicidal thoughts you must seek medical attention immediately by calling 911 or calling your MD immediately  if symptoms less severe.   You must read complete instructions/literature along with all the possible adverse reactions/side effects for all the Medicines you take and that have been prescribed to you. Take any new Medicines after you have completely understood and accpet all the possible adverse reactions/side effects.    Do not drive when taking Pain medications or sleeping medications (Benzodaizepines)   Do not take  more than prescribed Pain, Sleep and Anxiety Medications. It is not advisable to combine anxiety,sleep and pain medications without talking with your primary care practitioner   Special Instructions: If you have smoked or chewed Tobacco  in the last 2 yrs please stop smoking, stop any regular Alcohol  and or any Recreational drug use.   Wear Seat belts while driving.   Please note: You were cared for by a hospitalist during your hospital stay. Once you are discharged, your primary care physician will handle any further medical issues. Please note that NO REFILLS for any discharge medications will be authorized once you are discharged, as it is imperative that you return to your primary care physician (or establish a relationship with a primary care physician if you do not have one) for your post hospital discharge needs so that they can reassess your need for medications and monitor your lab values.  Time coordinating discharge: 40  minutes  SIGNED:  Pamella Pert, MD, PhD 01/24/2024, 7:35 AM

## 2024-01-24 NOTE — Plan of Care (Signed)

## 2024-01-24 NOTE — Progress Notes (Signed)
 IV removed, dressed, instructions reviewed with understanding verbalized, transferred to front entrance ambulatory.
